# Patient Record
Sex: Female | Born: 1950
Health system: Southern US, Community
[De-identification: ages and names within clinical notes are randomized; demographics above are authoritative.]

## PROBLEM LIST (undated history)

## (undated) DIAGNOSIS — I10 Essential (primary) hypertension: Secondary | ICD-10-CM

## (undated) DIAGNOSIS — F32A Depression, unspecified: Secondary | ICD-10-CM

## (undated) DIAGNOSIS — Z8041 Family history of malignant neoplasm of ovary: Secondary | ICD-10-CM

## (undated) DIAGNOSIS — F329 Major depressive disorder, single episode, unspecified: Secondary | ICD-10-CM

## (undated) DIAGNOSIS — Z806 Family history of leukemia: Secondary | ICD-10-CM

## (undated) DIAGNOSIS — C801 Malignant (primary) neoplasm, unspecified: Secondary | ICD-10-CM

## (undated) DIAGNOSIS — Z803 Family history of malignant neoplasm of breast: Secondary | ICD-10-CM

## (undated) DIAGNOSIS — Z800 Family history of malignant neoplasm of digestive organs: Secondary | ICD-10-CM

## (undated) DIAGNOSIS — G709 Myoneural disorder, unspecified: Secondary | ICD-10-CM

## (undated) DIAGNOSIS — K2970 Gastritis, unspecified, without bleeding: Secondary | ICD-10-CM

## (undated) DIAGNOSIS — M5030 Other cervical disc degeneration, unspecified cervical region: Secondary | ICD-10-CM

## (undated) HISTORY — DX: Major depressive disorder, single episode, unspecified: F32.9

## (undated) HISTORY — DX: Family history of malignant neoplasm of breast: Z80.3

## (undated) HISTORY — DX: Myoneural disorder, unspecified: G70.9

## (undated) HISTORY — PX: TUBAL LIGATION: SHX77

## (undated) HISTORY — PX: BREAST SURGERY: SHX581

## (undated) HISTORY — DX: Family history of leukemia: Z80.6

## (undated) HISTORY — DX: Family history of malignant neoplasm of ovary: Z80.41

## (undated) HISTORY — DX: Depression, unspecified: F32.A

## (undated) HISTORY — DX: Family history of malignant neoplasm of digestive organs: Z80.0

## (undated) HISTORY — DX: Malignant (primary) neoplasm, unspecified: C80.1

## (undated) HISTORY — DX: Essential (primary) hypertension: I10

## (undated) HISTORY — DX: Other cervical disc degeneration, unspecified cervical region: M50.30

---

## 2011-01-22 ENCOUNTER — Emergency Department (HOSPITAL_BASED_OUTPATIENT_CLINIC_OR_DEPARTMENT_OTHER)
Admission: EM | Admit: 2011-01-22 | Discharge: 2011-01-22 | Disposition: A | Discharge status: Home / Self Care | Payer: Self-pay | Attending: Emergency Medicine | Admitting: Emergency Medicine

## 2011-01-22 DIAGNOSIS — K047 Periapical abscess without sinus: Secondary | ICD-10-CM | POA: Insufficient documentation

## 2015-10-03 DIAGNOSIS — K219 Gastro-esophageal reflux disease without esophagitis: Secondary | ICD-10-CM | POA: Insufficient documentation

## 2016-09-21 ENCOUNTER — Encounter: Payer: Self-pay | Admitting: Hematology & Oncology

## 2016-09-21 ENCOUNTER — Other Ambulatory Visit (HOSPITAL_BASED_OUTPATIENT_CLINIC_OR_DEPARTMENT_OTHER): Payer: Medicare HMO

## 2016-09-21 ENCOUNTER — Ambulatory Visit (HOSPITAL_BASED_OUTPATIENT_CLINIC_OR_DEPARTMENT_OTHER): Payer: Medicare HMO | Admitting: Hematology & Oncology

## 2016-09-21 ENCOUNTER — Ambulatory Visit: Payer: Medicare HMO

## 2016-09-21 VITALS — BP 173/78 | HR 76 | Temp 98.1°F | Resp 20 | Wt 125.0 lb

## 2016-09-21 DIAGNOSIS — Z800 Family history of malignant neoplasm of digestive organs: Secondary | ICD-10-CM

## 2016-09-21 DIAGNOSIS — N6459 Other signs and symptoms in breast: Secondary | ICD-10-CM

## 2016-09-21 DIAGNOSIS — D500 Iron deficiency anemia secondary to blood loss (chronic): Secondary | ICD-10-CM

## 2016-09-21 DIAGNOSIS — E559 Vitamin D deficiency, unspecified: Secondary | ICD-10-CM

## 2016-09-21 DIAGNOSIS — Z803 Family history of malignant neoplasm of breast: Secondary | ICD-10-CM

## 2016-09-21 DIAGNOSIS — Z8041 Family history of malignant neoplasm of ovary: Secondary | ICD-10-CM

## 2016-09-21 DIAGNOSIS — D510 Vitamin B12 deficiency anemia due to intrinsic factor deficiency: Secondary | ICD-10-CM

## 2016-09-21 DIAGNOSIS — M818 Other osteoporosis without current pathological fracture: Secondary | ICD-10-CM

## 2016-09-21 LAB — COMPREHENSIVE METABOLIC PANEL (CC13)
A/G RATIO: 1.2 (ref 1.2–2.2)
ALBUMIN: 4.2 g/dL (ref 3.6–4.8)
ALT: 12 IU/L (ref 0–32)
AST (SGOT): 16 IU/L (ref 0–40)
Alkaline Phosphatase, S: 63 IU/L (ref 39–117)
BUN / CREAT RATIO: 18 (ref 12–28)
BUN: 11 mg/dL (ref 8–27)
CHLORIDE: 103 mmol/L (ref 96–106)
Calcium, Ser: 9.6 mg/dL (ref 8.7–10.3)
Carbon Dioxide, Total: 28 mmol/L (ref 18–29)
Creatinine, Ser: 0.62 mg/dL (ref 0.57–1.00)
GFR calc non Af Amer: 95 mL/min/{1.73_m2} (ref >59)
GFR, EST AFRICAN AMERICAN: 109 mL/min/{1.73_m2} (ref >59)
GLUCOSE: 93 mg/dL (ref 65–99)
Globulin, Total: 3.4 g/dL (ref 1.5–4.5)
POTASSIUM: 3.6 mmol/L (ref 3.5–5.2)
SODIUM: 134 mmol/L (ref 134–144)
TOTAL PROTEIN: 7.6 g/dL (ref 6.0–8.5)

## 2016-09-21 LAB — CBC WITH DIFFERENTIAL (CANCER CENTER ONLY)
BASO#: 0 10*3/uL (ref 0.0–0.2)
BASO%: 0.3 % (ref 0.0–2.0)
EOS%: 0.9 % (ref 0.0–7.0)
Eosinophils Absolute: 0.1 10*3/uL (ref 0.0–0.5)
HEMATOCRIT: 35.9 % (ref 34.8–46.6)
HGB: 12 g/dL (ref 11.6–15.9)
LYMPH#: 2.3 10*3/uL (ref 0.9–3.3)
LYMPH%: 29.6 % (ref 14.0–48.0)
MCH: 30.1 pg (ref 26.0–34.0)
MCHC: 33.4 g/dL (ref 32.0–36.0)
MCV: 90 fL (ref 81–101)
MONO#: 0.4 10*3/uL (ref 0.1–0.9)
MONO%: 5.5 % (ref 0.0–13.0)
NEUT#: 5 10*3/uL (ref 1.5–6.5)
NEUT%: 63.7 % (ref 39.6–80.0)
Platelets: 242 10*3/uL (ref 145–400)
RBC: 3.99 10*6/uL (ref 3.70–5.32)
RDW: 12.3 % (ref 11.1–15.7)
WBC: 7.9 10*3/uL (ref 3.9–10.0)

## 2016-09-21 LAB — CHCC SATELLITE - SMEAR

## 2016-09-21 NOTE — Progress Notes (Signed)
09/21/2016 Pt states she is not taking the prescribed meds:  Gabapentin, Trazodone, Pantoprazole, Losartan, Valium.

## 2016-09-21 NOTE — Progress Notes (Signed)
Referral MD  Reason for Referral: Evaluate for malignancy   Chief Complaint  Patient presents with  . Initial Assessment    Lumps in breast, pain.  : I think I have cancer.  HPI: Deanna Reynolds is a very charming 66 year old African-American female. She is originally from [place]. [place]. She actually went back up to [place] for about 7 months to help take care of a family member. There is a very strong family history of malignancy. She says there's been audible family members with breast cancer, ovarian cancer, and pancreatic cancer.  Her last mammogram was done about 2 years ago. She's never had a colonoscopy.  She is complaining of some pain in the right breast. She has not noted any breast swelling. There's been no arm swelling.  She's had no problems with weight loss or weight gain. Her weight has gone down a little bit. She is not as hungry. She is not noting any change in bowels or bladder.  She has not had any rashes. There's been no bleeding. She did have endometriosis in the past.  She has had past surgery without any difficulties.  She has had no ROMs with her monthly cycles. She started when she was 66 years old. She had her menopause at about 66 years old.  She apparently is "financially challenged" right now. She says she is taking some of her medications because they are not helping. She is not having any test done because of lack of finances. I told her that we can always help with this.  She has not had any dysphagia or odynophagia.  She smoked for about 5 years. She's not smoked for several years.  She is a grandmother. Her immediate family are all doing well.  She has had no cough. She's had no nausea or vomiting.  Overall, her performance status is ECOG 0.   History reviewed. No pertinent past medical history.:  History reviewed. No pertinent surgical history.:   Current Outpatient Prescriptions:  .   DULoxetine (CYMBALTA) 30 MG capsule, 30 mg daily., Disp: , Rfl:  .  hydrochlorothiazide (HYDRODIURIL) 25 MG tablet, 25 mg., Disp: , Rfl:  .  HYDROcodone-acetaminophen (NORCO/VICODIN) 5-325 MG tablet, 5-325 tablets at bedtime as needed., Disp: , Rfl:  .  losartan (COZAAR) 100 MG tablet, , Disp: , Rfl: :  :  Allergies  Allergen Reactions  . Motrin [Ibuprofen] Nausea And Vomiting  :  History reviewed. No pertinent family history.:  Social History   Social History  . Marital status: Divorced    Spouse name: N/A  . Number of children: N/A  . Years of education: N/A   Occupational History  . Not on file.   Social History Main Topics  . Smoking status: Former Smoker    Packs/day: 0.25    Years: 5.00    Types: Cigarettes    Quit date: 08/21/2014  . Smokeless tobacco: Never Used  . Alcohol use No  . Drug use: No  . Sexual activity: Not Currently   Other Topics Concern  . Not on file   Social History Narrative  . No narrative on file  :  Pertinent items are noted in HPI.  Exam: @[facility] @ Well-developed and well-nourished African-American female in no obvious distress. Vital signs show temperature of 98.1. Pulse 76. Blood pressure 173/78. Weight is 125 pounds. Head head and neck exam shows no ocular or oral lesions. There are no palpable  cervical or supraclavicular lymph nodes. Lungs are clear bilaterally. She has no rales, wheezes or rhonchi. Cardiac exam regular rate and rhythm with no murmurs, rubs or bruits. Breast exam shows no breast masses bilaterally. There is no breast asymmetry. There is no nipple discharge bilaterally. She has no bilateral axillary adenopathy. Back exam shows no tenderness over the spine, ribs or hips. Abdominal exam shows a soft abdomen with good bowel sounds. There is no fluid wave. There is no palpable liver or spleen tip. Extremities shows no clubbing, cyanosis or edema. She has good range of motion of her joints. Back exam shows no kyphosis. Skin  exam shows no rashes, ecchymoses or petechia. Neurological exam shows no focal neurological deficits.    Recent Labs  09/21/16 1441  WBC 7.9  HGB 12.0  HCT 35.9  PLT 242   No results for input(s): NA, K, CL, CO2, GLUCOSE, BUN, CREATININE, CALCIUM in the last 72 hours.  Blood smear review:  None  Pathology: None     Assessment and Plan:  Deanna Reynolds is a very charming 66 year old African-American female. There is a strong family history of cancer. She, herself, does not have any obvious malignancy. However, she definitely needs to have annual studies done to prevent any malignancy from occurring.  The least we can do is get her set up with a mammogram.  I did gave her stool cards to take home. I told her how to do these. She will return these when she has completed the cards.  I don't see that we need any CAT scans or chest x-ray.  Of note, she has a very low vitamin D level that was run by her family doctor. I told her to take 2000 units of vitamin D daily.  I did give her a prayer blanket. She has a very strong faith. We had good fellowship. I spent about 50 minutes with her today.  She felt much better after talking with [place]. She will need to have a genetic test done given her strong family history. We can get this arranged we see her next.  I probably will get her back to be seen in another 6 weeks.

## 2016-09-22 LAB — IRON AND TIBC
%SAT: 33 % (ref 21–57)
IRON: 105 ug/dL (ref 41–142)
TIBC: 320 ug/dL (ref 236–444)
UIBC: 215 ug/dL (ref 120–384)

## 2016-09-22 LAB — RETICULOCYTES: RETICULOCYTE COUNT: 0.7 % (ref 0.6–2.6)

## 2016-09-22 LAB — VITAMIN B12: VITAMIN B 12: 344 pg/mL (ref 232–1245)

## 2016-09-22 LAB — FERRITIN: Ferritin: 21 ng/ml (ref 9–269)

## 2016-09-22 LAB — LACTATE DEHYDROGENASE: LDH: 172 U/L (ref 125–245)

## 2016-10-01 ENCOUNTER — Ambulatory Visit (HOSPITAL_BASED_OUTPATIENT_CLINIC_OR_DEPARTMENT_OTHER)
Admission: RE | Admit: 2016-10-01 | Discharge: 2016-10-01 | Disposition: A | Discharge status: Home / Self Care | Payer: Medicare HMO | Source: Ambulatory Visit | Attending: Hematology & Oncology | Admitting: Hematology & Oncology

## 2016-10-01 ENCOUNTER — Encounter (HOSPITAL_BASED_OUTPATIENT_CLINIC_OR_DEPARTMENT_OTHER): Payer: Self-pay | Admitting: Radiology

## 2016-10-01 ENCOUNTER — Other Ambulatory Visit: Payer: Self-pay | Admitting: Hematology & Oncology

## 2016-10-01 DIAGNOSIS — Z803 Family history of malignant neoplasm of breast: Secondary | ICD-10-CM

## 2016-10-01 DIAGNOSIS — Z1231 Encounter for screening mammogram for malignant neoplasm of breast: Secondary | ICD-10-CM | POA: Insufficient documentation

## 2016-10-07 ENCOUNTER — Other Ambulatory Visit: Payer: Self-pay | Admitting: Hematology & Oncology

## 2016-10-07 DIAGNOSIS — R928 Other abnormal and inconclusive findings on diagnostic imaging of breast: Secondary | ICD-10-CM

## 2016-10-12 ENCOUNTER — Ambulatory Visit
Admission: RE | Admit: 2016-10-12 | Discharge: 2016-10-12 | Disposition: A | Discharge status: Home / Self Care | Payer: Medicare HMO | Source: Ambulatory Visit | Attending: Hematology & Oncology | Admitting: Hematology & Oncology

## 2016-10-12 DIAGNOSIS — R928 Other abnormal and inconclusive findings on diagnostic imaging of breast: Secondary | ICD-10-CM

## 2016-11-01 ENCOUNTER — Other Ambulatory Visit: Payer: Self-pay | Admitting: *Deleted

## 2016-11-01 DIAGNOSIS — K219 Gastro-esophageal reflux disease without esophagitis: Secondary | ICD-10-CM

## 2016-11-02 ENCOUNTER — Other Ambulatory Visit (HOSPITAL_BASED_OUTPATIENT_CLINIC_OR_DEPARTMENT_OTHER): Payer: Medicare HMO

## 2016-11-02 ENCOUNTER — Ambulatory Visit (HOSPITAL_BASED_OUTPATIENT_CLINIC_OR_DEPARTMENT_OTHER): Payer: Medicare HMO | Admitting: Hematology & Oncology

## 2016-11-02 VITALS — BP 135/66 | HR 85 | Temp 98.2°F | Resp 16 | Wt 117.0 lb

## 2016-11-02 DIAGNOSIS — Z809 Family history of malignant neoplasm, unspecified: Secondary | ICD-10-CM

## 2016-11-02 DIAGNOSIS — N6310 Unspecified lump in the right breast, unspecified quadrant: Secondary | ICD-10-CM

## 2016-11-02 DIAGNOSIS — K219 Gastro-esophageal reflux disease without esophagitis: Secondary | ICD-10-CM

## 2016-11-02 DIAGNOSIS — M818 Other osteoporosis without current pathological fracture: Secondary | ICD-10-CM

## 2016-11-02 DIAGNOSIS — N63 Unspecified lump in unspecified breast: Secondary | ICD-10-CM

## 2016-11-02 LAB — CBC WITH DIFFERENTIAL (CANCER CENTER ONLY)
BASO#: 0 10*3/uL (ref 0.0–0.2)
BASO%: 0.3 % (ref 0.0–2.0)
EOS ABS: 0.1 10*3/uL (ref 0.0–0.5)
EOS%: 0.8 % (ref 0.0–7.0)
HEMATOCRIT: 37.2 % (ref 34.8–46.6)
HEMOGLOBIN: 12.4 g/dL (ref 11.6–15.9)
LYMPH#: 2.7 10*3/uL (ref 0.9–3.3)
LYMPH%: 35.3 % (ref 14.0–48.0)
MCH: 29.2 pg (ref 26.0–34.0)
MCHC: 33.3 g/dL (ref 32.0–36.0)
MCV: 88 fL (ref 81–101)
MONO#: 0.4 10*3/uL (ref 0.1–0.9)
MONO%: 4.7 % (ref 0.0–13.0)
NEUT%: 58.9 % (ref 39.6–80.0)
NEUTROS ABS: 4.5 10*3/uL (ref 1.5–6.5)
Platelets: 214 10*3/uL (ref 145–400)
RBC: 4.24 10*6/uL (ref 3.70–5.32)
RDW: 12.7 % (ref 11.1–15.7)
WBC: 7.7 10*3/uL (ref 3.9–10.0)

## 2016-11-02 LAB — COMPREHENSIVE METABOLIC PANEL (CC13)
ALBUMIN: 4.2 g/dL (ref 3.6–4.8)
ALK PHOS: 57 IU/L (ref 39–117)
ALT: 10 IU/L (ref 0–32)
AST: 14 IU/L (ref 0–40)
Albumin/Globulin Ratio: 1.3 (ref 1.2–2.2)
BILIRUBIN TOTAL: 0.3 mg/dL (ref 0.0–1.2)
BUN / CREAT RATIO: 17 (ref 12–28)
BUN: 11 mg/dL (ref 8–27)
CHLORIDE: 101 mmol/L (ref 96–106)
CREATININE: 0.63 mg/dL (ref 0.57–1.00)
Calcium, Ser: 9.4 mg/dL (ref 8.7–10.3)
Carbon Dioxide, Total: 28 mmol/L (ref 18–29)
GFR calc Af Amer: 108 mL/min/{1.73_m2} (ref >59)
GFR calc non Af Amer: 94 mL/min/{1.73_m2} (ref >59)
GLOBULIN, TOTAL: 3.2 g/dL (ref 1.5–4.5)
GLUCOSE: 137 mg/dL — AB (ref 65–99)
Potassium, Ser: 3.6 mmol/L (ref 3.5–5.2)
SODIUM: 137 mmol/L (ref 134–144)
TOTAL PROTEIN: 7.4 g/dL (ref 6.0–8.5)

## 2016-11-02 NOTE — Progress Notes (Signed)
Hematology and Oncology Follow Up Visit  Deanna Reynolds [id number] Nov 11, 1950 66 y.o. 11/02/2016   Principle Diagnosis:   Strong family history of cancer  Current Therapy:    Observation     Interim History:  Deanna Reynolds is back for a second office visit. We saw her back in February. At that time, she came in with a strong family history of malignancy. Several family members have had different kind of malignancies.  We did get a mammogram on her. She had a mammogram done. This was done in February. This showed a lesion at the 10:00 position in the right breast. An ultrasound was then done. This showed a 4 x 3 x 6 mm hypo-echoic mass. This was felt to be fibrocystic changes.  Deanna Reynolds was not pleased with her visit to the mammography center. She is very concerned about this lesion. I think the only way that we can probably help her is to get a MRI of the breast. She is agreeable to this.  We saw her, we gave her stool cards. She is not yet done this. I told her to get this done and please bring them to our office.  I will get her set up with a genetic counselor so that she can go over Deanna Reynolds's family history to see what type of genetic testing he is to be done.  We last saw her, her labs all looked okay. She was not iron deficient.  She has had no fever. She has had no rashes. She has had no obvious changes with her breast.  Overall, her performance status is ECOG 1.  Medications:  Current Outpatient Prescriptions:  .  DULoxetine (CYMBALTA) 30 MG capsule, 30 mg daily., Disp: , Rfl:  .  hydrochlorothiazide (HYDRODIURIL) 25 MG tablet, 25 mg., Disp: , Rfl:  .  HYDROcodone-acetaminophen (NORCO/VICODIN) 5-325 MG tablet, 5-325 tablets at bedtime as needed., Disp: , Rfl:  .  losartan (COZAAR) 100 MG tablet, , Disp: , Rfl:   Allergies:  Allergies  Allergen Reactions  . Motrin [Ibuprofen] Nausea And Vomiting    Past Medical History, Surgical history, Social history, and Family  History were reviewed and updated.  Review of Systems: As above  Physical Exam:  weight is 117 lb (53.1 kg). Her oral temperature is 98.2 F (36.8 C). Her blood pressure is 135/66 and her pulse is 85. Her respiration is 16 and oxygen saturation is 99%.   Wt Readings from Last 3 Encounters:  11/02/16 117 lb (53.1 kg)  09/21/16 125 lb (56.7 kg)     Well-developed and well-nourished African-American female. Her head and neck exam shows no ocular or oral lesions. There are no palpable cervical or supraclavicular lymph nodes. Lungs are clear bilaterally. She has no rales, wheezes or rhonchi. Cardiac exam regular rate and rhythm with no murmurs, rubs or bruits. Breast exam shows no breast masses bilaterally. There is no breast asymmetry. There is no nipple discharge bilaterally. She has no bilateral axillary adenopathy. Back exam shows no tenderness over the spine, ribs or hips. Abdominal exam shows a soft abdomen with good bowel sounds. There is no fluid wave. There is no palpable liver or spleen tip. Extremities shows no clubbing, cyanosis or edema. She has good range of motion of her joints. Back exam shows no kyphosis. Skin exam shows no rashes, ecchymoses or petechia. Neurological exam shows no focal neurological deficits.   Lab Results  Component Value Date   WBC 7.7 11/02/2016   HGB 12.4 11/02/2016  HCT 37.2 11/02/2016   MCV 88 11/02/2016   PLT 214 11/02/2016     Chemistry      Component Value Date/Time   NA 134 09/21/2016 1441   K 3.6 09/21/2016 1441   CL 103 09/21/2016 1441   CO2 28 09/21/2016 1441   BUN 11 09/21/2016 1441   CREATININE 0.62 09/21/2016 1441      Component Value Date/Time   CALCIUM 9.6 09/21/2016 1441   ALKPHOS 63 09/21/2016 1441   AST 16 09/21/2016 1441   ALT 12 09/21/2016 1441   BILITOT <0.2 09/21/2016 1441         Impression and Plan: Deanna Reynolds is a 66 year old African-American female. From my perspective, she does not have any obvious malignancy.  However, she is still very worried about this.  I told her that she needs to have the stool cards done. If there is blood, then she will clearly need to have a colonoscopy and possibly upper endoscopy.  We will see about getting an MRI done. This will make her feel a lot better.  We spent about 35 minutes with her.  I will plan to see her back in about 6 weeks or so. Hopefully, she will have seen the genetic counselor. Hopefully she wouldn't have brought in the stool cards.     Volanda Napoleon, MD 3/28/20185:20 PM

## 2016-11-03 LAB — VITAMIN D 25 HYDROXY (VIT D DEFICIENCY, FRACTURES): Vitamin D, 25-Hydroxy: 28.5 ng/mL — ABNORMAL LOW (ref 30.0–100.0)

## 2016-11-04 ENCOUNTER — Telehealth: Payer: Self-pay | Admitting: *Deleted

## 2016-11-04 NOTE — Telephone Encounter (Addendum)
Patient is aware of results. She is currently taking 1000 units. Will increase to 2000 units  ----- Message from Volanda Napoleon, MD sent at 11/03/2016  6:45 AM EDT ----- Call - Vit D is low!!  You must be taking 2000units a day!!!! pete

## 2016-11-13 ENCOUNTER — Other Ambulatory Visit: Payer: Medicare HMO

## 2016-11-29 ENCOUNTER — Other Ambulatory Visit: Payer: Self-pay | Admitting: Lab

## 2016-11-29 ENCOUNTER — Other Ambulatory Visit: Payer: Self-pay | Admitting: *Deleted

## 2016-11-29 ENCOUNTER — Telehealth: Payer: Self-pay | Admitting: *Deleted

## 2016-11-29 DIAGNOSIS — Z803 Family history of malignant neoplasm of breast: Secondary | ICD-10-CM | POA: Diagnosis not present

## 2016-11-29 DIAGNOSIS — K219 Gastro-esophageal reflux disease without esophagitis: Secondary | ICD-10-CM

## 2016-11-29 DIAGNOSIS — Z800 Family history of malignant neoplasm of digestive organs: Secondary | ICD-10-CM | POA: Diagnosis not present

## 2016-11-29 DIAGNOSIS — Z8041 Family history of malignant neoplasm of ovary: Secondary | ICD-10-CM

## 2016-11-29 DIAGNOSIS — N6310 Unspecified lump in the right breast, unspecified quadrant: Secondary | ICD-10-CM | POA: Diagnosis not present

## 2016-11-29 LAB — FECAL OCCULT BLOOD, GUIAC - CHCC SATELLITE: Occult Blood: NEGATIVE

## 2016-11-29 NOTE — Telephone Encounter (Addendum)
Patient aware of results  ----- Message from Volanda Napoleon, MD sent at 11/29/2016 10:14 AM EDT ----- Call - the stool cards are all NEGATIVE for blood!!!  pete

## 2016-12-15 ENCOUNTER — Other Ambulatory Visit: Payer: Medicare HMO

## 2016-12-15 ENCOUNTER — Ambulatory Visit: Payer: Medicare HMO | Admitting: Hematology & Oncology

## 2016-12-28 ENCOUNTER — Encounter: Payer: Self-pay | Admitting: Genetics

## 2017-01-11 ENCOUNTER — Ambulatory Visit
Admission: RE | Admit: 2017-01-11 | Discharge: 2017-01-11 | Disposition: A | Discharge status: Home / Self Care | Payer: Medicare HMO | Source: Ambulatory Visit | Attending: Hematology & Oncology | Admitting: Hematology & Oncology

## 2017-01-11 DIAGNOSIS — N63 Unspecified lump in unspecified breast: Secondary | ICD-10-CM

## 2017-01-11 DIAGNOSIS — Z809 Family history of malignant neoplasm, unspecified: Secondary | ICD-10-CM

## 2017-01-11 MED ORDER — GADOBENATE DIMEGLUMINE 529 MG/ML IV SOLN
10.0000 mL | Freq: Once | INTRAVENOUS | Status: AC | PRN
  Administered 2017-01-11: 10 mL via INTRAVENOUS

## 2017-01-16 ENCOUNTER — Other Ambulatory Visit: Payer: Self-pay | Admitting: Hematology & Oncology

## 2017-01-16 DIAGNOSIS — N6310 Unspecified lump in the right breast, unspecified quadrant: Secondary | ICD-10-CM

## 2017-01-17 ENCOUNTER — Other Ambulatory Visit: Payer: Self-pay | Admitting: Family

## 2017-01-17 DIAGNOSIS — N63 Unspecified lump in unspecified breast: Secondary | ICD-10-CM

## 2017-01-18 ENCOUNTER — Other Ambulatory Visit: Payer: Medicare HMO

## 2017-01-18 ENCOUNTER — Ambulatory Visit: Payer: Medicare HMO | Admitting: Hematology & Oncology

## 2017-01-26 ENCOUNTER — Other Ambulatory Visit: Payer: Medicare HMO

## 2017-01-26 ENCOUNTER — Encounter: Payer: Medicare HMO | Admitting: Genetics

## 2017-01-26 ENCOUNTER — Ambulatory Visit
Admission: RE | Admit: 2017-01-26 | Discharge: 2017-01-26 | Disposition: A | Discharge status: Home / Self Care | Payer: Medicare HMO | Source: Ambulatory Visit | Attending: Family | Admitting: Family

## 2017-01-26 DIAGNOSIS — N63 Unspecified lump in unspecified breast: Secondary | ICD-10-CM

## 2017-01-26 MED ORDER — GADOBENATE DIMEGLUMINE 529 MG/ML IV SOLN
10.0000 mL | Freq: Once | INTRAVENOUS | Status: AC | PRN
  Administered 2017-01-26: 10 mL via INTRAVENOUS

## 2017-02-13 ENCOUNTER — Other Ambulatory Visit: Payer: Self-pay | Admitting: Surgery

## 2017-02-13 DIAGNOSIS — R1011 Right upper quadrant pain: Secondary | ICD-10-CM

## 2017-02-14 ENCOUNTER — Telehealth: Payer: Self-pay | Admitting: *Deleted

## 2017-02-14 ENCOUNTER — Encounter: Payer: Self-pay | Admitting: Physician Assistant

## 2017-02-14 NOTE — Telephone Encounter (Signed)
Patient is calling to cancel tomorrows appointment. She wants to take care of several health items before returning to this office for further care. She is being worked up for possible gallbladder issues and needs to have a colonoscopy. She states she has appointments for both and once everything is taken care of she will reschedule here.  Reviewed with Dr Marin Olp. Appointment tomorrow cancelled.

## 2017-02-15 ENCOUNTER — Other Ambulatory Visit: Payer: Medicare HMO

## 2017-02-15 ENCOUNTER — Ambulatory Visit: Payer: Medicare HMO | Admitting: Hematology & Oncology

## 2017-02-22 ENCOUNTER — Other Ambulatory Visit: Payer: Medicare HMO

## 2017-02-22 ENCOUNTER — Other Ambulatory Visit: Payer: Self-pay | Admitting: Surgery

## 2017-02-22 ENCOUNTER — Ambulatory Visit
Admission: RE | Admit: 2017-02-22 | Discharge: 2017-02-22 | Disposition: A | Discharge status: Home / Self Care | Payer: Medicare HMO | Source: Ambulatory Visit | Attending: Surgery | Admitting: Surgery

## 2017-02-22 ENCOUNTER — Encounter: Payer: Self-pay | Admitting: Physician Assistant

## 2017-02-22 ENCOUNTER — Ambulatory Visit (INDEPENDENT_AMBULATORY_CARE_PROVIDER_SITE_OTHER): Payer: Medicare HMO | Admitting: Physician Assistant

## 2017-02-22 VITALS — BP 110/60 | HR 68 | Ht 63.0 in | Wt 111.0 lb

## 2017-02-22 DIAGNOSIS — R52 Pain, unspecified: Secondary | ICD-10-CM

## 2017-02-22 DIAGNOSIS — R194 Change in bowel habit: Secondary | ICD-10-CM | POA: Diagnosis not present

## 2017-02-22 DIAGNOSIS — R634 Abnormal weight loss: Secondary | ICD-10-CM | POA: Diagnosis not present

## 2017-02-22 DIAGNOSIS — R112 Nausea with vomiting, unspecified: Secondary | ICD-10-CM

## 2017-02-22 DIAGNOSIS — R1084 Generalized abdominal pain: Secondary | ICD-10-CM | POA: Diagnosis not present

## 2017-02-22 MED ORDER — IOPAMIDOL (ISOVUE-300) INJECTION 61%
100.0000 mL | Freq: Once | INTRAVENOUS | Status: AC | PRN
  Administered 2017-02-22: 100 mL via INTRAVENOUS

## 2017-02-22 MED ORDER — NA SULFATE-K SULFATE-MG SULF 17.5-3.13-1.6 GM/177ML PO SOLN: ORAL | 0 refills | Status: DC

## 2017-02-22 NOTE — Patient Instructions (Signed)
You have been scheduled for a colonoscopy and endoscopy. Please follow written instructions given to you at your visit today.  Please pick up your prep supplies at the pharmacy within the next 1-3 days.  If you use inhalers (even only as needed), please bring them with you on the day of your procedure. Your physician has requested that you go to www.[email or web address].com and enter the access code given to you at your visit today. This web site gives a general overview about your procedure. However, you should still follow specific instructions given to you by our office regarding your preparation for the procedure.  We have put you on a wait list for a sooner procedure appointment.

## 2017-02-22 NOTE — Progress Notes (Signed)
Chief Complaint: Weight loss, abdominal pain, nausea and vomiting, change in bowel habits  HPI:  Deanna Reynolds is a 66 year old African-American female with a past medical history as below, who was referred to me by Donnie Mesa, MD for a complaint of change in bowel habits, weight loss, nausea and vomiting and abdominal pain .     Today, the patient presents to clinic and tells me that for the past year she has had episodic nausea and vomiting symptoms which occur 1- 2 times per week. She tells me that her previous primary care provider was just "treating me with pills", and never "would find out what was going on". Patient stopped the Zofran which she says was somewhat helpful because she wanted to figure out what was going on. She tells me that this is "unexpected". It happens out of the blue and is unrelated to eating.     Also describes an epigastric/right upper quadrant abdominal pain which has been occurring over that period of time as well. She describes this as an 8/10 pain which is sharp in the last 20 minutes when it occurs. This seems somewhat worse when she eats things such as "pasta with white rice". She also tells me that when she exercises she feels as though something "moves out of place", and she has to "put it back" right under the right side of her rib cage. This has been getting worse recently as well. Patient is scheduled for a CT later today.   Patient also tells me today that she has had a change from regular stools to a mix between diarrhea and normal over the past year. She never knows what she is going to get. Associated symptoms included a weight loss of around 30 pounds in 5 months without trying. Patient also describes occasional subjective fever and chills.   Patient's family history is positive for cancer of the breast and ovaries, patient tells me that every member of her family died from cancer and "never of natural causes".   Patient denies seeing bright red blood or  black tarry sticky stools, change in diet or change in medications, anorexia, heartburn, reflux or symptoms that awaken her at night.  Past Medical History:  Diagnosis Date  . DDD (degenerative disc disease), cervical   . Depression   . Hypertension     Past Surgical History:  Procedure Laterality Date  . TUBAL LIGATION      Current Outpatient Prescriptions  Medication Sig Dispense Refill  . Ascorbic Acid (VITAMIN C) 1000 MG tablet Take 1,000 mg by mouth daily.    . Cholecalciferol (VITAMIN D-3) 5000 units TABS Take 1 tablet by mouth daily.    . Cyanocobalamin (VITAMIN B-12 PO) Take 1 tablet by mouth daily.    . Flaxseed, Linseed, (FLAX SEED OIL PO) Take by mouth. One teaspoon daily    . hydrochlorothiazide (HYDRODIURIL) 25 MG tablet 25 mg.    . HYDROcodone-acetaminophen (NORCO/VICODIN) 5-325 MG tablet 5-325 tablets at bedtime as needed.    Marland Kitchen losartan (COZAAR) 100 MG tablet     . MAGNESIUM PO Take 1 tablet by mouth daily.    Marland Kitchen VITAMIN E PO Take 1 tablet by mouth daily.    . Na Sulfate-K Sulfate-Mg Sulf 17.5-3.13-1.6 GM/180ML SOLN Take as directed for colonoscopy 354 mL 0   No current facility-administered medications for this visit.     Allergies as of 02/22/2017 - Review Complete 02/22/2017  Allergen Reaction Noted  . Motrin [ibuprofen] Nausea And Vomiting  09/21/2016    Family History  Problem Relation Age of Onset  . Breast cancer Mother   . Breast cancer Sister   . Breast cancer Maternal Aunt   . Breast cancer Maternal Grandmother   . Breast cancer Cousin   . Leukemia Father   . Colon cancer Neg Hx     Social History   Social History  . Marital status: Divorced    Spouse name: N/A  . Number of children: 1  . Years of education: N/A   Occupational History  . retired Biomedical scientist    Social History Main Topics  . Smoking status: Former Smoker    Packs/day: 0.25    Years: 5.00    Types: Cigarettes    Quit date: 08/21/2014  . Smokeless tobacco:  Never Used  . Alcohol use No  . Drug use: No  . Sexual activity: Not Currently   Other Topics Concern  . Not on file   Social History Narrative  . No narrative on file    Review of Systems:    Constitutional: No weight loss, fever or chills Skin: No rash Cardiovascular: No chest pain Respiratory: No SOB  Gastrointestinal: See HPI and otherwise negative Genitourinary: No dysuria  Neurological: No headache Musculoskeletal: No new muscle or joint pain Hematologic: No bleeding  Psychiatric: No history of depression or anxiety    Physical Exam:  Vital signs: BP 110/60   Pulse 68   Ht 5\' 3"  (1.6 m)   Wt 111 lb (50.3 kg)   BMI 19.66 kg/m   Constitutional:   Pleasant thin appearing African American female appears to be in NAD, Well developed, Well nourished, alert and cooperative Head:  Normocephalic and atraumatic. Eyes:   PEERL, EOMI. No icterus. Conjunctiva pink. Ears:  Normal auditory acuity. Neck:  Supple Throat: Oral cavity and pharynx without inflammation, swelling or lesion.  Respiratory: Respirations even and unlabored. Lungs clear to auscultation bilaterally.   No wheezes, crackles, or rhonchi.  Cardiovascular: Normal S1, S2. No MRG. Regular rate and rhythm. No peripheral edema, cyanosis or pallor.  Gastrointestinal:  Soft, nondistended,Mild epigastric and RUQ ttp No rebound or guarding. Normal bowel sounds. No appreciable masses or hepatomegaly. Rectal:  Not performed.  Msk:  Symmetrical without gross deformities. Without edema, no deformity or joint abnormality.  Neurologic:  Alert and  oriented x4;  grossly normal neurologically.  Skin:   Dry and intact without significant lesions or rashes. Psychiatric: Demonstrates good judgement and reason without abnormal affect or behaviors.  MOST RECENT LABS AND IMAGING: CBC    Component Value Date/Time   WBC 7.7 11/02/2016 1421   RBC 4.24 11/02/2016 1421   HGB 12.4 11/02/2016 1421   HCT 37.2 11/02/2016 1421   PLT  214 11/02/2016 1421   MCV 88 11/02/2016 1421   MCH 29.2 11/02/2016 1421   MCHC 33.3 11/02/2016 1421   RDW 12.7 11/02/2016 1421   LYMPHSABS 2.7 11/02/2016 1421   EOSABS 0.1 11/02/2016 1421   BASOSABS 0.0 11/02/2016 1421    CMP     Component Value Date/Time   NA 137 11/02/2016 1421   K 3.6 11/02/2016 1421   CL 101 11/02/2016 1421   CO2 28 11/02/2016 1421   GLUCOSE 137 (H) 11/02/2016 1421   BUN 11 11/02/2016 1421   CREATININE 0.63 11/02/2016 1421   CALCIUM 9.4 11/02/2016 1421   PROT 7.4 11/02/2016 1421   ALBUMIN 4.2 11/02/2016 1421   AST 14 11/02/2016 1421   ALT 10 11/02/2016 1421  ALKPHOS 57 11/02/2016 1421   BILITOT 0.3 11/02/2016 1421   GFRNONAA 94 11/02/2016 1421   GFRAA 108 11/02/2016 1421    Assessment: 1. Weight loss: Around 35 pounds in the past 5 months without trying, significant family history of cancer; concern for cancer vs other 2. Abdominal pain: Epigastric/right upper quadrant, seems to be related to eating, question gallbladder versus gastric etiology 3. Change in bowel habits: Patient describes varying between normal and loose stools over the past year; question relation IBS versus other 4. Nausea and vomiting: Intermittent episodes 1-2 times per week unrelated to eating per the patient, some help from Zofran in the past which patient has since discontinued because "I don't want to be on medicine if I don't know what I'm treating"; Consider gastritis versus functional dyspepsia versus other  Plan: 1. Await results of CT abdomen and pelvis scheduled today, previously ordered by oncologist 2. Recommend a colonoscopy and endoscopy for further evaluation of significant weight loss. This is scheduled with Dr. Ardis Hughs in [facility]. Discussed risks, benefits, limitations and alternatives and the patient agrees to proceed. 3. Patient to follow in clinic per recommendations from Dr. Ardis Hughs after time of procedure.  Ellouise Newer, PA-C [facility] 02/22/2017,  1:48 PM  Cc: Donnie Mesa, MD

## 2017-02-22 NOTE — Progress Notes (Signed)
I agree with the above note, plan 

## 2017-02-24 ENCOUNTER — Other Ambulatory Visit: Payer: Medicare HMO

## 2017-02-27 NOTE — Progress Notes (Signed)
Please call the patient and let them know that their CT scan did not show any sign of suspicious masses.  Her stomach looked thickened, which may represent gastritis.  She needs a referral to GI for possible endoscopy.  I need to see her after that.  Thanks

## 2017-03-01 ENCOUNTER — Other Ambulatory Visit: Payer: Medicare HMO

## 2017-04-05 ENCOUNTER — Encounter: Payer: Self-pay | Admitting: Gastroenterology

## 2017-04-05 ENCOUNTER — Ambulatory Visit (AMBULATORY_SURGERY_CENTER): Payer: Medicare HMO | Admitting: Gastroenterology

## 2017-04-05 VITALS — BP 124/72 | HR 68 | Temp 98.7°F | Resp 12 | Ht 63.0 in | Wt 111.0 lb

## 2017-04-05 DIAGNOSIS — R634 Abnormal weight loss: Secondary | ICD-10-CM

## 2017-04-05 DIAGNOSIS — K2990 Gastroduodenitis, unspecified, without bleeding: Secondary | ICD-10-CM | POA: Diagnosis not present

## 2017-04-05 DIAGNOSIS — K2950 Unspecified chronic gastritis without bleeding: Secondary | ICD-10-CM | POA: Diagnosis not present

## 2017-04-05 DIAGNOSIS — K6389 Other specified diseases of intestine: Secondary | ICD-10-CM

## 2017-04-05 DIAGNOSIS — R197 Diarrhea, unspecified: Secondary | ICD-10-CM

## 2017-04-05 DIAGNOSIS — K2980 Duodenitis without bleeding: Secondary | ICD-10-CM | POA: Diagnosis not present

## 2017-04-05 MED ORDER — SODIUM CHLORIDE 0.9 % IV SOLN: 500.0000 mL | INTRAVENOUS | Status: DC

## 2017-04-05 NOTE — Progress Notes (Signed)
Called to room to assist during endoscopic procedure.  Patient ID and intended procedure confirmed with present staff. Received instructions for my participation in the procedure from the performing physician.  

## 2017-04-05 NOTE — Op Note (Signed)
[facility] Patient Name: Deanna Reynolds Procedure Date: 04/05/2017 1:29 PM MRN: [id number] Endoscopist: Milus Banister , MD Age: 66 Referring MD:  Date of Birth: 05-22-1951 Gender: Female Account #: [id number] Procedure:                Upper GI endoscopy Indications:              Generalized abdominal pain, Abnormal CT of the GI                            tract, Nausea with vomiting, Weight loss Medicines:                Monitored Anesthesia Care Procedure:                Pre-Anesthesia Assessment:                           - Prior to the procedure, a History and Physical                            was performed, and patient medications and                            allergies were reviewed. The patient's tolerance of                            previous anesthesia was also reviewed. The risks                            and benefits of the procedure and the sedation                            options and risks were discussed with the patient.                            All questions were answered, and informed consent                            was obtained. Prior Anticoagulants: The patient has                            taken no previous anticoagulant or antiplatelet                            agents. ASA Grade Assessment: II - A patient with                            mild systemic disease. After reviewing the risks                            and benefits, the patient was deemed in                            satisfactory condition to undergo the procedure.  After obtaining informed consent, the endoscope was                            passed under direct vision. Throughout the                            procedure, the patient's blood pressure, pulse, and                            oxygen saturations were monitored continuously. The                            Model GIF-[id number]) scope was introduced                            through the  mouth, and advanced to the second part                            of duodenum. The upper GI endoscopy was                            accomplished without difficulty. The patient                            tolerated the procedure well. Scope In: Scope Out: Findings:                 The esophagus was normal.                           There was mild to moderated duodenitis in proximal                            duodenal bulb. This was biopsied.                           Mild inflammation characterized by erythema was                            found in the gastric antrum. Biopsies were taken                            with a cold forceps for histology.                           The proximal stomach was abnormal, edematous and                            somewhat snake-skin in appearance (not overtly                            neoplastic). Biopsies were taken.                           There were no gastric or esophageal varices.  The exam was otherwise without abnormality. Complications:            No immediate complications. Estimated blood loss:                            None. Estimated Blood Loss:     Estimated blood loss: none. Impression:               - Normal esophagus.                           - Gastritis and duodenitis, biopsied.                           - The examination was otherwise normal. Recommendation:           - Patient has a contact number available for                            emergencies. The signs and symptoms of potential                            delayed complications were discussed with the                            patient. Return to normal activities tomorrow.                            Written discharge instructions were provided to the                            patient.                           - Resume previous diet.                           - Continue present medications.                           - Await pathology  results.                           - Strict avoidance of NSAIDs. Milus Banister, MD 04/05/2017 2:04:52 PM This report has been signed electronically.

## 2017-04-05 NOTE — Op Note (Signed)
[name] Procedure Date: 04/05/2017 1:29 PM MRN: [id number] Endoscopist: Milus Banister , MD Age: 66 Referring MD:  Date of Birth: 1950-12-14 Gender: Female Account #: [id number] Procedure:                Colonoscopy Indications:              Generalized abdominal pain, Diarrhea, Weight loss Medicines:                Monitored Anesthesia Care Procedure:                Pre-Anesthesia Assessment:                           - Prior to the procedure, a History and Physical                            was performed, and patient medications and                            allergies were reviewed. The patient's tolerance of                            previous anesthesia was also reviewed. The risks                            and benefits of the procedure and the sedation                            options and risks were discussed with the patient.                            All questions were answered, and informed consent                            was obtained. Prior Anticoagulants: The patient has                            taken no previous anticoagulant or antiplatelet                            agents. ASA Grade Assessment: II - A patient with                            mild systemic disease. After reviewing the risks                            and benefits, the patient was deemed in                            satisfactory condition to undergo the procedure.                           After obtaining informed consent, the colonoscope  was passed under direct vision. Throughout the                            procedure, the patient's blood pressure, pulse, and                            oxygen saturations were monitored continuously. The                            Colonoscope was introduced through the anus and                            advanced to the the terminal ileum. The colonoscopy                            was  performed without difficulty. The patient                            tolerated the procedure well. The quality of the                            bowel preparation was good. The terminal ileum,                            ileocecal valve, appendiceal orifice, and rectum                            were photographed. Scope In: 1:34:10 PM Scope Out: 1:46:21 PM Scope Withdrawal Time: 0 hours 8 minutes 18 seconds  Total Procedure Duration: 0 hours 12 minutes 11 seconds  Findings:                 The terminal ileum appeared normal.                           There was a clean based, shallow, somewhat linear                            erosion on the distal component of the IC valve                            (NSAID effect?) Biopsies were taken with a cold                            forceps for histology.                           The exam was otherwise without abnormality on                            direct and retroflexion views.                           Biopsies for histology were taken with a cold  forceps from the entire colon for evaluation of                            microscopic colitis. Complications:            No immediate complications. Estimated blood loss:                            None. Estimated Blood Loss:     Estimated blood loss: none. Impression:               - The examined portion of the ileum was normal.                           - IC valve erosion (NSAID effect?), biopsied.                           - The examination was otherwise normal on direct                            and retroflexion views.                           - Biopsies were taken with a cold forceps from the                            entire colon for evaluation of microscopic colitis. Recommendation:           - Patient has a contact number available for                            emergencies. The signs and symptoms of potential                            delayed complications  were discussed with the                            patient. Return to normal activities tomorrow.                            Written discharge instructions were provided to the                            patient.                           - Resume previous diet.                           - Continue present medications.                           - Await pathology results.                           - Repeat colonoscopy in 10 years for screening  purposes. Milus Banister, MD 04/05/2017 2:00:09 PM This report has been signed electronically.

## 2017-04-05 NOTE — Progress Notes (Signed)
Report to PACU, RN, vss, BBS= Clear.  

## 2017-04-05 NOTE — Patient Instructions (Signed)
Discharge instructions given. Biopsies taken. Resume previous medications. Avoid NSAIDS. YOU HAD AN ENDOSCOPIC PROCEDURE TODAY AT [facility]:   Refer to the procedure report that was given to you for any specific questions about what was found during the examination.  If the procedure report does not answer your questions, please call your gastroenterologist to clarify.  If you requested that your care partner not be given the details of your procedure findings, then the procedure report has been included in a sealed envelope for you to review at your convenience later.  YOU SHOULD EXPECT: Some feelings of bloating in the abdomen. Passage of more gas than usual.  Walking can help get rid of the air that was put into your GI tract during the procedure and reduce the bloating. If you had a lower endoscopy (such as a colonoscopy or flexible sigmoidoscopy) you may notice spotting of blood in your stool or on the toilet paper. If you underwent a bowel prep for your procedure, you may not have a normal bowel movement for a few days.  Please Note:  You might notice some irritation and congestion in your nose or some drainage.  This is from the oxygen used during your procedure.  There is no need for concern and it should clear up in a day or so.  SYMPTOMS TO REPORT IMMEDIATELY:   Following lower endoscopy (colonoscopy or flexible sigmoidoscopy):  Excessive amounts of blood in the stool  Significant tenderness or worsening of abdominal pains  Swelling of the abdomen that is new, acute  Fever of 100F or higher   Following upper endoscopy (EGD)  Vomiting of blood or coffee ground material  New chest pain or pain under the shoulder blades  Painful or persistently difficult swallowing  New shortness of breath  Fever of 100F or higher  Black, tarry-looking stools  For urgent or emergent issues, a gastroenterologist can be reached at any hour by calling [phone].   DIET:   We do recommend a small meal at first, but then you may proceed to your regular diet.  Drink plenty of fluids but you should avoid alcoholic beverages for 24 hours.  ACTIVITY:  You should plan to take it easy for the rest of today and you should NOT DRIVE or use heavy machinery until tomorrow (because of the sedation medicines used during the test).    FOLLOW UP: Our staff will call the number listed on your records the next business day following your procedure to check on you and address any questions or concerns that you may have regarding the information given to you following your procedure. If we do not reach you, we will leave a message.  However, if you are feeling well and you are not experiencing any problems, there is no need to return our call.  We will assume that you have returned to your regular daily activities without incident.  If any biopsies were taken you will be contacted by phone or by letter within the next 1-3 weeks.  Please call us at [phone] if you have not heard about the biopsies in 3 weeks.    SIGNATURES/CONFIDENTIALITY: You and/or your care partner have signed paperwork which will be entered into your electronic medical record.  These signatures attest to the fact that that the information above on your After Visit Summary has been reviewed and is understood.  Full responsibility of the confidentiality of this discharge information lies with you and/or your care-partner.

## 2017-04-06 ENCOUNTER — Telehealth: Payer: Self-pay | Admitting: *Deleted

## 2017-04-06 NOTE — Telephone Encounter (Signed)
  Follow up Call-  Call back number 04/05/2017  Post procedure Call Back phone  # [phone]  Permission to leave phone message Yes  Some recent data might be hidden    Called [phone] Patient questions:  Do you have a fever, pain , or abdominal swelling? No. Pain Score  0 *  Have you tolerated food without any problems? Yes.    Have you been able to return to your normal activities? Yes.    Do you have any questions about your discharge instructions: Diet   No. Medications  No. Follow up visit  No.  Do you have questions or concerns about your Care? No.  Actions: * If pain score is 4 or above: No action needed, pain <4.

## 2017-04-12 ENCOUNTER — Other Ambulatory Visit: Payer: Self-pay

## 2017-04-12 ENCOUNTER — Encounter: Payer: Self-pay | Admitting: Gastroenterology

## 2017-04-12 MED ORDER — BIS SUBCIT-METRONID-TETRACYC 140-125-125 MG PO CAPS: 3.0000 | ORAL_CAPSULE | Freq: Three times a day (TID) | ORAL | 0 refills | Status: DC

## 2017-04-13 ENCOUNTER — Telehealth: Payer: Self-pay | Admitting: Gastroenterology

## 2017-04-13 NOTE — Telephone Encounter (Signed)
Pharmacy notified that prior Josem Kaufmann has been completed, waiting for response.

## 2017-04-17 ENCOUNTER — Ambulatory Visit: Payer: Self-pay | Admitting: Surgery

## 2017-04-17 DIAGNOSIS — N6091 Unspecified benign mammary dysplasia of right breast: Secondary | ICD-10-CM

## 2017-04-17 NOTE — H&P (Signed)
History of Present Illness Deanna Reynolds. Jams Trickett MD; 04/17/2017 3:29 PM) The patient is a 66 year old female who presents with a breast mass. Referred by Dr. Burney Gauze Dr. Pamelia Hoit for right breast [facility]  This is a 65 year old female who initially went to see Dr. Marin Olp because of a very strong history of cancer in her family. She had not had a mammogram in 2 years. She has never had a colonoscopy. She was evaluated by oncology who sent her for mammogram. There were some questionable findings are resulted in an MRI which subsequently resulted in a biopsy by MR guidance. She had an area of linear enhancement in the lower outer quadrant. There was another right breast mass at 9:00 that was not visualized on the MRI at the time of biopsy. 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 A biopsy clip was placed. She is now referred for surgical evaluation.  The patient has had significant weight loss with no explanation for the last several months. She has dropped almost 30 pounds and it appears that she was fairly thin even before that time. She describes no attempts at weight loss and has not noticed any change in her appetite. She also describes vague abdominal pain that seems to be localized mostly in the right upper quadrant. This does not seem to be correlated with any type of food or even a postprandial occurrence. She denies any nausea vomiting or diarrhea. Sometimes the pain occurs when she is exercising. Sometimes the pain radiates through to her back.  She has been referred to genetic counseling but had to reschedule her appointment because of a family situation.  Menarche - age 38 First pregnancy - 25 Breastfeed - 3 months Hormones - none Menopause - late 51's   Family history Multiple relatives with breast, ovarian, and pancreatic cancers  We evaluated her abdominal symptoms with a CT scan  which showed only some mildly wall thickening in the gastric cardia consistent with gastritis. She was referred to GI for endoscopy. EGD biopsies were positive for H. Pylori. She has not started her H. Pylori treatment yet because of issues with obtaining the medication from the pharmacy. She has begun gaining back some wait and her abdominal symptoms seem to be significantly improved.  CLINICAL DATA: 66 y/o F; 66 y/o F; right upper quadrant pain and fever.  EXAM: CT ABDOMEN AND PELVIS WITH CONTRAST  TECHNIQUE: Multidetector CT imaging of the abdomen and pelvis was performed using the standard protocol following bolus administration of intravenous contrast.  CONTRAST: 136m ISOVUE-300 IOPAMIDOL (ISOVUE-300) INJECTION 61%  COMPARISON: None.  FINDINGS: Lower chest: No acute abnormality.  Hepatobiliary: Subcentimeter well-circumscribed lucencies in the right lobe of liver likely represent small cysts. No other focal liver abnormality. Normal gallbladder. No intra or extrahepatic biliary ductal dilatation.  Pancreas: Unremarkable. No pancreatic ductal dilatation or surrounding inflammatory changes.  Spleen: Normal in size without focal abnormality.  Adrenals/Urinary Tract: Right kidney interpolar lesion measuring 22 x 33 x 28 mm with multiple internal septations (series 2, image 28 and series 3, image 47).  Normal adrenal glands. Subcentimeter left kidney interpolar cyst. No hydronephrosis. Normal bladder.  Stomach/Bowel: Wall thickening within the gastric cardia and proximal body may represent gastritis. No obstructive or inflammatory changes of small and large bowel. Normal appendix.  Vascular/Lymphatic: Aortic atherosclerosis with moderate mixed plaque. No enlarged abdominal or pelvic lymph nodes.  Reproductive: 29 mm uterine myoma.  Other: No abdominal  wall hernia or abnormality. No abdominopelvic ascites.  Musculoskeletal: No acute or significant osseous  findings.  IMPRESSION: 1. Wall thickening within gastric cardia and proximal body may represent gastritis. 2. 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 Renal protocol CT or MRI is recommended at 6 and 12 months, then yearly for 5 years. This recommendation follows ACR consensus guidelines: Management of the Incidental Renal Mass on CT: A White Paper of the ACR Incidental Findings Committee. J Am Coll Radiol [phone]. 3. Aortic atherosclerosis. 4. Myomatous uterus.   Electronically Signed By: Kristine Garbe M.D. On: 02/22/2017 16:32   Diagnosis 1. Surgical [P], ileocecal valve erosions - BENIGN COLORECTAL-TYPE MUCOSA WITH LYMPHOID AGGREGATES. - THERE IS NO EVIDENCE OF COLITIS, DYSPLASIA OR MALIGNANCY. 2. Surgical [P], random sites - BENIGN COLONIC MUCOSA. - NO SIGNIFICANT INFLAMMATION OR OTHER ABNORMALITIES IDENTIFIED. 3. Surgical [P], duodenum bulb - PEPTIC DUODENITIS. - THERE IS NO EVIDENCE OF DYSPLASIA OR MALIGNANCY. 4. Surgical [P], distal stomach - CHRONIC GASTRITIS WITH HELICOBACTER PYLORI ORGANISMS. - THERE IS NO EVIDENCE OF DYSPLASIA OR MALIGNANCY. - SEE COMMENT. 5. Surgical [P], proximal stomach - CHRONIC GASTRITIS WITH HELICOBACTER PYLORI ORGANISMS. - THERE IS NO EVIDENCE OF DYSPLASIA OR MALIGNANCY. - SEE COMMENT. Microscopic Comment 4. Warthin Starry stains performed on parts 4 and 5 highlight the presence of Helicobacter pylori organisms. Enid Cutter MD Pathologist, Electronic Signature (Case signed 04/11/2017)     CLINICAL DATA: Screening.  EXAM: 2D DIGITAL SCREENING BILATERAL MAMMOGRAM WITH CAD AND ADJUNCT TOMO  COMPARISON: Previous exam(s).  ACR Breast Density Category b: There are scattered areas of fibroglandular density.  FINDINGS: In the right breast, a possible mass warrants further evaluation. In the left breast, no findings suspicious for  malignancy. Images were processed with CAD.  IMPRESSION: Further evaluation is suggested for possible mass in the right breast.  RECOMMENDATION: Diagnostic mammogram and possibly ultrasound of the right breast. (Code:[id number])  The patient will be contacted regarding the findings, and additional imaging will be scheduled.  BI-RADS CATEGORY 0: Incomplete. Need additional imaging evaluation and/or prior mammograms for comparison.   Electronically Signed By: Nolon Nations M.D. On: 10/05/2016 07:53  CLINICAL DATA: 66 year old female for evaluation of possible right breast mass on screening mammogram. Patient complains today of diffuse bilateral breast pain.  EXAM: 2D DIGITAL DIAGNOSTIC RIGHT MAMMOGRAM WITH ADJUNCT TOMO  ULTRASOUND RIGHT BREAST  COMPARISON: Previous exam(s).  ACR Breast Density Category b: There are scattered areas of fibroglandular density.  FINDINGS: 2D and 3D spot compression views of the right breast demonstrate a very faint circumscribed oval mass within the upper-outer right breast.  Targeted ultrasound is performed, showing a 4 x 3 x 6 mm circumscribed oval hypoechoic mass at the 10 o'clock position of the right breast 4 cm from the nipple likely representing the mammographic finding. This most likely represents fibrocystic changes or possibly a benign lymph node.  IMPRESSION: 6 mm likely benign mass in the upper-outer right breast. Six-month followup is recommended to ensure stability.  RECOMMENDATION: Right diagnostic mammogram with possible right breast ultrasound in 6 months.  I have discussed the findings, causes of and remedies for breast pain and recommendations with the patient. Results were also provided in writing at the conclusion of the visit. If applicable, a reminder letter will be sent to the patient regarding the next appointment.  BI-RADS CATEGORY 3: Probably benign.   Electronically Signed By: Margarette Canada  M.D. On: 10/12/2016 14:59  ADDENDUM REPORT: 02/13/2017  09:09 ADDENDUM: This is an addendum to an MRI he performed on 01/11/2017. In the report it stated that linear enhancement was seen in the upper-outer quadrant of the right breast but should have stated the linear enhancement was in the lower outer quadrant of the right breast. Electronically Signed By: Lillia Mountain M.D. On: 02/13/2017 09:09 Addended by Luvenia Redden, MD on 02/13/2017 9:12 AM  Study Result CLINICAL DATA: Family history of breast cancer in 2 grandmothers, 2 aunts and 2 sisters. Diffuse bilateral breast pain with discharge. Patient feels lumps under both arms. Probable benign lesion was seen in the right breast on a prior diagnostic study dated 10/12/2016. LABS: Creatinine was obtained on site at [place] at [address] Results: Creatinine 0.9 mg/dL. EXAM: BILATERAL BREAST MRI WITH AND WITHOUT CONTRAST TECHNIQUE: Multiplanar, multisequence MR images of both breasts were obtained prior to and following the intravenous administration of 10 ml of MultiHance. THREE-DIMENSIONAL MR IMAGE RENDERING ON INDEPENDENT WORKSTATION: Three-dimensional MR images were rendered by post-processing of the original MR data on an independent workstation. The three-dimensional MR images were interpreted, and findings are reported in the following complete MRI report for this study. Three dimensional images were evaluated at the independent DynaCad workstation COMPARISON: Previous exam(s). FINDINGS: Breast composition: b. Scattered fibroglandular tissue. Background parenchymal enhancement: Moderate. Right breast: In the lateral aspect of the anterior third of the right breast is an indeterminate 4 mm enhancing nodule. In upper outer quadrant of the far posterior aspect of the right breast there is linear enhancement spanning 1.6 cm. Ductal carcinoma in-situ could not be excluded. Adjacent and superolateral to the  linear enhancement in the upper-outer quadrant is an indeterminate 5 mm enhancing nodule. Left breast: No mass or abnormal enhancement. Lymph nodes: There is no enlarged axillary adenopathy. Ancillary findings: None. IMPRESSION: Linear enhancement in the far posterior aspect of the upper-outer quadrant of the right breast. Ductal carcinoma in-situ could not be excluded. Two enhancing nodules in the lateral (9 o'clock) area of the right breast and upper outer quadrant. RECOMMENDATION: MR guided core biopsies of the linear enhancement in the right breast as well as the nodule in the 9 o'clock region of the right breast is recommended. If the MR guided core biopsies are benign I would recommend a short-term interval follow-up MRI in 6 months. BI-RADS CATEGORY 4: Suspicious. Electronically Signed: By: Lillia Mountain M.D. On: 01/11/2017 13:00   CLINICAL DATA: 66 year old female for MRI guided biopsies of an indeterminate area of linear enhancement in the lower, outer right breast and of a right breast mass at 9 o'clock.  EXAM: MRI GUIDED CORE NEEDLE BIOPSY OF THE RIGHT BREAST  TECHNIQUE: Multiplanar, multisequence MR imaging of the right breast was performed both before and after administration of intravenous contrast.  CONTRAST: 12m MULTIHANCE GADOBENATE DIMEGLUMINE 529 MG/ML IV SOLN  COMPARISON: Previous exams.  FINDINGS: I met with the patient, and we discussed the procedure of MRI guided biopsy, including risks, benefits, and alternatives. Specifically, we discussed the risks of infection, bleeding, tissue injury, clip migration, and inadequate sampling. Informed, written consent was given. The usual time out protocol was performed immediately prior to the procedure.  Using sterile technique, 1% Lidocaine, MRI guidance, and a 9 gauge vacuum assisted device, biopsy was performed of the area of linear, non mass enhancement in the lower, outer right breast using a lateral  to medial approach. At the conclusion of the procedure, a dumbbell-shaped tissue marker clip was deployed into the biopsy cavity. Follow-up  2-view mammogram was performed and dictated separately.  The right breast mass at 9 o'clock described in the patient's MRI report from 01/11/2017 was not visualized on today's exam to allow for biopsy.  IMPRESSION: 1. MRI guided biopsy of linear, non mass enhancement in the lower, outer right breast. 2. The right breast mass at 9 o'clock described in the patient's MRI report from 01/11/2017 was not visualized on today's exam to allow for biopsy. A six-month follow-up bilateral breast MRI with contrast is recommended for follow-up of the mass at 9 o'clock and of an additional mass described in the lower, outer right breast on recent MRI.  Electronically Signed: By: Pamelia Hoit M.D. On: 01/26/2017 10:02  ADDENDUM REPORT: 01/27/2017 14:31  ADDENDUM: Pathology revealed ATYPICAL LOBULAR HYPERPLASIA WITH CALCIFICATIONS, SCLEROSING PAPILLARY LESION of the Right breast, lower outer quadrant. This was found to be concordant by Dr. Pamelia Hoit, with excision recommended. Pathology results were discussed with the patient by telephone. The patient reported doing well after the biopsy with tenderness at the site. Post biopsy instructions and care were reviewed and questions were answered. The patient was encouraged to call [facility] for any additional concerns. Surgical consultation has been arranged with Dr. Donnie Mesa at [facility], at the request of Dr. Burney Gauze, on February 13, 2017.  Pathology results reported by Terie Purser, RN on 01/27/2017.   Electronically Signed By: Pamelia Hoit M.D. On: 01/27/2017 14:31  CLINICAL DATA: Status post MRI guided right breast biopsy  EXAM: DIAGNOSTIC RIGHT MAMMOGRAM POST MRI BIOPSY  COMPARISON: Previous exam(s).  FINDINGS: Mammographic images were obtained  following MRI guided biopsy of an indeterminate area of linear, non mass enhancement in the lower, outer right breast. Post biopsy mammogram demonstrates the dumbbell-shaped biopsy marker to be in the expected location within the lower, outer right breast, posterior depth.  IMPRESSION: Appropriate marker position as above.  Final Assessment: Post Procedure Mammograms for Marker Placement   Electronically Signed By: Pamelia Hoit M.D. On: 01/26/2017 10:03   Problem List/Past Medical Rodman Key K. 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 Violanda Bobeck, MD; 04/17/2017 3:30 PM) Breast Biopsy Bilateral. multiple Breast Mass; Local Excision Left. Oral Surgery  Diagnostic Studies History Deanna Reynolds. Gad Aymond, MD; 04/17/2017 3:30 PM) Colonoscopy never Mammogram within last year Pap Smear 1-5 years ago  Allergies Rodman Key K. Jacquez Sheetz, MD; 04/17/2017 3:30 PM) Ibuprofen *ANALGESICS - ANTI-INFLAMMATORY* Nausea, Vomiting.  Social History Deanna Reynolds. Albert Hersch, MD; 04/17/2017 3:30 PM) Alcohol use Occasional alcohol use. Caffeine use Coffee, Tea. No drug use Tobacco use Former smoker.  Family History Deanna Reynolds. Reyaansh Merlo, MD; 04/17/2017 3:30 PM) Alcohol Abuse Brother, Family Members In General, Father, Mother. Arthritis Brother, Mother, Sister. Breast Cancer Family Members In General, Sister. Cerebrovascular Accident Mother. Cervical Cancer Family Members In General, Sister. Depression Daughter. Diabetes Mellitus Family Members In General, Mother. Heart Disease Family Members In General, Mother. Heart disease in female family member before age 38 Hypertension Mother. Migraine Headache Father, Sister. Ovarian Cancer Family Members In General. Respiratory Condition Mother. Seizure disorder Brother. Thyroid problems  Sister.  Pregnancy / Birth History Deanna Reynolds. Rosina Cressler, MD; 04/17/2017 3:30 PM) Age at menarche 47 years. Age of menopause 72-55 Gravida 1 Irregular periods Length (months) of breastfeeding 3-6 Maternal age 87-25 Para 8  Other Problems Deanna Reynolds. Marveline Profeta, MD; 04/17/2017 3:30 PM) Arthritis Back Pain Depression Heart murmur Hemorrhoids  Lump In Breast Migraine Headache Oophorectomy Left.    Vitals (Janette Ranson CMA; 04/17/2017 10:57 AM) 04/17/2017 10:57 AM Weight: 119 lb Height: 61in Body Surface Area: 1.52 m Body Mass Index: 22.48 kg/m  Pulse: 77 (Regular)  BP: 120/72 (Sitting, Left Arm, Standard)      Physical Exam Rodman Key K. Elisia Stepp MD; 04/17/2017 3:30 PM)  The physical exam findings are as follows: Note:WDWN in NAD Eyes: Pupils equal, round; sclera anicteric HENT: Oral mucosa moist; good dentition Neck: No masses palpated, no thyromegaly Breasts: symmetric; bruising has resolved; no other dominant masses; no axillary lymphadenopathy; no nipple retraction or discharge Lungs: CTA bilaterally; normal respiratory effort CV: Regular rate and rhythm; no murmurs; extremities well-perfused with no edema Abd: +bowel sounds, soft, mild diffuse tenderness; minimally tender in RUQ, no palpable organomegaly; no palpable hernias Skin: Warm, dry; no sign of jaundice Psychiatric - alert and oriented x 4; calm mood and affect    Assessment & Plan Rodman Key K. 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 The surgical procedure has been discussed with the patient. Potential risks, benefits, alternative treatments, and expected outcomes have been explained. All of the patient's questions at this time have been answered. The likelihood of reaching the patient's treatment goal is good. The patient understand the proposed surgical  procedure and wishes to proceed.  Deanna Reynolds. Georgette Dover, MD, [facility]  General/ Trauma Surgery  04/17/2017 3:30 PM

## 2017-04-19 ENCOUNTER — Other Ambulatory Visit: Payer: Self-pay | Admitting: Surgery

## 2017-04-19 ENCOUNTER — Encounter: Payer: Medicare HMO | Admitting: Gastroenterology

## 2017-04-19 ENCOUNTER — Encounter (HOSPITAL_BASED_OUTPATIENT_CLINIC_OR_DEPARTMENT_OTHER): Payer: Self-pay | Admitting: *Deleted

## 2017-04-19 DIAGNOSIS — N6091 Unspecified benign mammary dysplasia of right breast: Secondary | ICD-10-CM

## 2017-04-21 ENCOUNTER — Encounter (HOSPITAL_BASED_OUTPATIENT_CLINIC_OR_DEPARTMENT_OTHER)
Admission: RE | Admit: 2017-04-21 | Discharge: 2017-04-21 | Disposition: A | Discharge status: Home / Self Care | Payer: Medicare HMO | Source: Ambulatory Visit | Attending: Surgery | Admitting: Surgery

## 2017-04-21 DIAGNOSIS — N6091 Unspecified benign mammary dysplasia of right breast: Secondary | ICD-10-CM | POA: Insufficient documentation

## 2017-04-21 DIAGNOSIS — R9431 Abnormal electrocardiogram [ECG] [EKG]: Secondary | ICD-10-CM | POA: Insufficient documentation

## 2017-04-21 DIAGNOSIS — Z01810 Encounter for preprocedural cardiovascular examination: Secondary | ICD-10-CM | POA: Diagnosis not present

## 2017-04-21 DIAGNOSIS — Z01812 Encounter for preprocedural laboratory examination: Secondary | ICD-10-CM | POA: Insufficient documentation

## 2017-04-21 LAB — BASIC METABOLIC PANEL
ANION GAP: 8 (ref 5–15)
BUN: 10 mg/dL (ref 6–20)
CHLORIDE: 105 mmol/L (ref 101–111)
CO2: 25 mmol/L (ref 22–32)
Calcium: 9.5 mg/dL (ref 8.9–10.3)
Creatinine, Ser: 0.86 mg/dL (ref 0.44–1.00)
GFR calc Af Amer: >60 mL/min (ref >60)
Glucose, Bld: 88 mg/dL (ref 65–99)
POTASSIUM: 3.7 mmol/L (ref 3.5–5.1)
SODIUM: 138 mmol/L (ref 135–145)

## 2017-04-21 NOTE — Progress Notes (Signed)
EKG reviewed by Dr. Marcell Barlow, will proceed with surgery as scheduled. Ensure pre-surgery drink given with instructions to complete by [facility], pt verbalized understanding.

## 2017-04-25 ENCOUNTER — Ambulatory Visit
Admission: RE | Admit: 2017-04-25 | Discharge: 2017-04-25 | Disposition: A | Discharge status: Home / Self Care | Payer: Medicare HMO | Source: Ambulatory Visit | Attending: Surgery | Admitting: Surgery

## 2017-04-25 DIAGNOSIS — N6091 Unspecified benign mammary dysplasia of right breast: Secondary | ICD-10-CM

## 2017-04-26 ENCOUNTER — Ambulatory Visit (HOSPITAL_BASED_OUTPATIENT_CLINIC_OR_DEPARTMENT_OTHER): Payer: Medicare HMO | Admitting: Anesthesiology

## 2017-04-26 ENCOUNTER — Encounter (HOSPITAL_BASED_OUTPATIENT_CLINIC_OR_DEPARTMENT_OTHER)
Admission: RE | Disposition: A | Discharge status: Home / Self Care | Payer: Self-pay | Source: Ambulatory Visit | Attending: Surgery

## 2017-04-26 ENCOUNTER — Encounter (HOSPITAL_BASED_OUTPATIENT_CLINIC_OR_DEPARTMENT_OTHER): Payer: Self-pay | Admitting: *Deleted

## 2017-04-26 ENCOUNTER — Ambulatory Visit
Admission: RE | Admit: 2017-04-26 | Discharge: 2017-04-26 | Disposition: A | Discharge status: Home / Self Care | Payer: Medicare HMO | Source: Ambulatory Visit | Attending: Surgery | Admitting: Surgery

## 2017-04-26 ENCOUNTER — Ambulatory Visit (HOSPITAL_BASED_OUTPATIENT_CLINIC_OR_DEPARTMENT_OTHER)
Admission: RE | Admit: 2017-04-26 | Discharge: 2017-04-26 | Disposition: A | Discharge status: Home / Self Care | Payer: Medicare HMO | Source: Ambulatory Visit | Attending: Surgery | Admitting: Surgery

## 2017-04-26 DIAGNOSIS — Z823 Family history of stroke: Secondary | ICD-10-CM | POA: Insufficient documentation

## 2017-04-26 DIAGNOSIS — Z8049 Family history of malignant neoplasm of other genital organs: Secondary | ICD-10-CM | POA: Insufficient documentation

## 2017-04-26 DIAGNOSIS — R634 Abnormal weight loss: Secondary | ICD-10-CM | POA: Diagnosis not present

## 2017-04-26 DIAGNOSIS — Z811 Family history of alcohol abuse and dependence: Secondary | ICD-10-CM | POA: Diagnosis not present

## 2017-04-26 DIAGNOSIS — N6091 Unspecified benign mammary dysplasia of right breast: Secondary | ICD-10-CM

## 2017-04-26 DIAGNOSIS — R011 Cardiac murmur, unspecified: Secondary | ICD-10-CM | POA: Insufficient documentation

## 2017-04-26 DIAGNOSIS — Z820 Family history of epilepsy and other diseases of the nervous system: Secondary | ICD-10-CM | POA: Insufficient documentation

## 2017-04-26 DIAGNOSIS — Z8349 Family history of other endocrine, nutritional and metabolic diseases: Secondary | ICD-10-CM | POA: Insufficient documentation

## 2017-04-26 DIAGNOSIS — Z886 Allergy status to analgesic agent status: Secondary | ICD-10-CM | POA: Insufficient documentation

## 2017-04-26 DIAGNOSIS — N6021 Fibroadenosis of right breast: Secondary | ICD-10-CM | POA: Insufficient documentation

## 2017-04-26 DIAGNOSIS — Z8261 Family history of arthritis: Secondary | ICD-10-CM | POA: Insufficient documentation

## 2017-04-26 DIAGNOSIS — Z818 Family history of other mental and behavioral disorders: Secondary | ICD-10-CM | POA: Diagnosis not present

## 2017-04-26 DIAGNOSIS — Z803 Family history of malignant neoplasm of breast: Secondary | ICD-10-CM | POA: Insufficient documentation

## 2017-04-26 DIAGNOSIS — N644 Mastodynia: Secondary | ICD-10-CM | POA: Insufficient documentation

## 2017-04-26 DIAGNOSIS — M1990 Unspecified osteoarthritis, unspecified site: Secondary | ICD-10-CM | POA: Insufficient documentation

## 2017-04-26 DIAGNOSIS — C50511 Malignant neoplasm of lower-outer quadrant of right female breast: Secondary | ICD-10-CM | POA: Insufficient documentation

## 2017-04-26 DIAGNOSIS — Z8249 Family history of ischemic heart disease and other diseases of the circulatory system: Secondary | ICD-10-CM | POA: Insufficient documentation

## 2017-04-26 DIAGNOSIS — G43909 Migraine, unspecified, not intractable, without status migrainosus: Secondary | ICD-10-CM | POA: Diagnosis not present

## 2017-04-26 DIAGNOSIS — N2889 Other specified disorders of kidney and ureter: Secondary | ICD-10-CM | POA: Diagnosis not present

## 2017-04-26 DIAGNOSIS — M549 Dorsalgia, unspecified: Secondary | ICD-10-CM | POA: Diagnosis not present

## 2017-04-26 DIAGNOSIS — Z87891 Personal history of nicotine dependence: Secondary | ICD-10-CM | POA: Insufficient documentation

## 2017-04-26 DIAGNOSIS — I700 Atherosclerosis of aorta: Secondary | ICD-10-CM | POA: Diagnosis not present

## 2017-04-26 DIAGNOSIS — Z8041 Family history of malignant neoplasm of ovary: Secondary | ICD-10-CM | POA: Diagnosis not present

## 2017-04-26 DIAGNOSIS — Z170 Estrogen receptor positive status [ER+]: Secondary | ICD-10-CM | POA: Diagnosis not present

## 2017-04-26 DIAGNOSIS — N62 Hypertrophy of breast: Secondary | ICD-10-CM | POA: Diagnosis present

## 2017-04-26 DIAGNOSIS — Z6820 Body mass index (BMI) 20.0-20.9, adult: Secondary | ICD-10-CM | POA: Insufficient documentation

## 2017-04-26 DIAGNOSIS — D259 Leiomyoma of uterus, unspecified: Secondary | ICD-10-CM | POA: Insufficient documentation

## 2017-04-26 DIAGNOSIS — F329 Major depressive disorder, single episode, unspecified: Secondary | ICD-10-CM | POA: Diagnosis not present

## 2017-04-26 HISTORY — DX: Gastritis, unspecified, without bleeding: K29.70

## 2017-04-26 HISTORY — PX: BREAST LUMPECTOMY WITH RADIOACTIVE SEED LOCALIZATION: SHX6424

## 2017-04-26 SURGERY — BREAST LUMPECTOMY WITH RADIOACTIVE SEED LOCALIZATION
Anesthesia: General | Site: Breast | Laterality: Right

## 2017-04-26 MED ORDER — OXYCODONE HCL 5 MG/5ML PO SOLN: 5.0000 mg | Freq: Once | ORAL | Status: DC | PRN

## 2017-04-26 MED ORDER — GABAPENTIN 300 MG PO CAPS
ORAL_CAPSULE | ORAL | Status: AC
  Filled 2017-04-26: qty 1

## 2017-04-26 MED ORDER — GABAPENTIN 300 MG PO CAPS
300.0000 mg | ORAL_CAPSULE | ORAL | Status: AC
  Administered 2017-04-26: 300 mg via ORAL

## 2017-04-26 MED ORDER — ACETAMINOPHEN 500 MG PO TABS
ORAL_TABLET | ORAL | Status: AC
  Filled 2017-04-26: qty 2

## 2017-04-26 MED ORDER — LIDOCAINE 2% (20 MG/ML) 5 ML SYRINGE
INTRAMUSCULAR | Status: DC | PRN
  Administered 2017-04-26: 60 mg via INTRAVENOUS

## 2017-04-26 MED ORDER — BUPIVACAINE-EPINEPHRINE 0.25% -1:200000 IJ SOLN
INTRAMUSCULAR | Status: DC | PRN
  Administered 2017-04-26: 10 mL

## 2017-04-26 MED ORDER — FENTANYL CITRATE (PF) 100 MCG/2ML IJ SOLN
INTRAMUSCULAR | Status: AC
  Filled 2017-04-26: qty 2

## 2017-04-26 MED ORDER — EPHEDRINE SULFATE 50 MG/ML IJ SOLN
INTRAMUSCULAR | Status: DC | PRN
  Administered 2017-04-26: 5 mg via INTRAVENOUS
  Administered 2017-04-26: 10 mg via INTRAVENOUS
  Administered 2017-04-26: 5 mg via INTRAVENOUS

## 2017-04-26 MED ORDER — ONDANSETRON HCL 4 MG/2ML IJ SOLN: 4.0000 mg | Freq: Four times a day (QID) | INTRAMUSCULAR | Status: DC | PRN

## 2017-04-26 MED ORDER — SCOPOLAMINE 1 MG/3DAYS TD PT72: 1.0000 | MEDICATED_PATCH | Freq: Once | TRANSDERMAL | Status: DC | PRN

## 2017-04-26 MED ORDER — MIDAZOLAM HCL 2 MG/2ML IJ SOLN
1.0000 mg | INTRAMUSCULAR | Status: DC | PRN
  Administered 2017-04-26: 2 mg via INTRAVENOUS

## 2017-04-26 MED ORDER — ONDANSETRON HCL 4 MG/2ML IJ SOLN
INTRAMUSCULAR | Status: AC
  Filled 2017-04-26: qty 2

## 2017-04-26 MED ORDER — CHLORHEXIDINE GLUCONATE CLOTH 2 % EX PADS: 6.0000 | MEDICATED_PAD | Freq: Once | CUTANEOUS | Status: DC

## 2017-04-26 MED ORDER — ACETAMINOPHEN 500 MG PO TABS
1000.0000 mg | ORAL_TABLET | ORAL | Status: AC
  Administered 2017-04-26: 1000 mg via ORAL

## 2017-04-26 MED ORDER — OXYCODONE HCL 5 MG PO TABS: 5.0000 mg | ORAL_TABLET | Freq: Once | ORAL | Status: DC | PRN

## 2017-04-26 MED ORDER — CEFAZOLIN SODIUM-DEXTROSE 2-4 GM/100ML-% IV SOLN
INTRAVENOUS | Status: AC
  Filled 2017-04-26: qty 100

## 2017-04-26 MED ORDER — PROPOFOL 10 MG/ML IV BOLUS
INTRAVENOUS | Status: DC | PRN
Start: 2017-04-26 — End: 2017-04-26
  Administered 2017-04-26: 120 mg via INTRAVENOUS

## 2017-04-26 MED ORDER — LACTATED RINGERS IV SOLN
INTRAVENOUS | Status: DC
  Administered 2017-04-26: 12:00:00 via INTRAVENOUS

## 2017-04-26 MED ORDER — MIDAZOLAM HCL 2 MG/2ML IJ SOLN
INTRAMUSCULAR | Status: AC
  Filled 2017-04-26: qty 2

## 2017-04-26 MED ORDER — CEFAZOLIN SODIUM-DEXTROSE 2-4 GM/100ML-% IV SOLN
2.0000 g | INTRAVENOUS | Status: AC
  Administered 2017-04-26: 2 g via INTRAVENOUS

## 2017-04-26 MED ORDER — FENTANYL CITRATE (PF) 100 MCG/2ML IJ SOLN
25.0000 ug | INTRAMUSCULAR | Status: DC | PRN
  Administered 2017-04-26: 25 ug via INTRAVENOUS
  Administered 2017-04-26: 50 ug via INTRAVENOUS
  Administered 2017-04-26 (×3): 25 ug via INTRAVENOUS

## 2017-04-26 MED ORDER — ONDANSETRON HCL 4 MG/2ML IJ SOLN
INTRAMUSCULAR | Status: DC | PRN
  Administered 2017-04-26: 4 mg via INTRAVENOUS

## 2017-04-26 MED ORDER — HYDROCODONE-ACETAMINOPHEN 5-325 MG PO TABS: 1.0000 | ORAL_TABLET | Freq: Four times a day (QID) | ORAL | 0 refills | Status: DC | PRN

## 2017-04-26 MED ORDER — FENTANYL CITRATE (PF) 100 MCG/2ML IJ SOLN
50.0000 ug | INTRAMUSCULAR | Status: AC | PRN
  Administered 2017-04-26 (×4): 25 ug via INTRAVENOUS

## 2017-04-26 SURGICAL SUPPLY — 54 items
APPLIER CLIP 9.375 MED OPEN (MISCELLANEOUS)
BENZOIN TINCTURE PRP APPL 2/3 (GAUZE/BANDAGES/DRESSINGS) ×3 IMPLANT
BLADE HEX COATED 2.75 (ELECTRODE) ×3 IMPLANT
BLADE SURG 15 STRL LF DISP TIS (BLADE) ×1 IMPLANT
BLADE SURG 15 STRL SS (BLADE) ×2
CANISTER SUC SOCK COL 7IN (MISCELLANEOUS) IMPLANT
CANISTER SUCT 1200ML W/VALVE (MISCELLANEOUS) ×3 IMPLANT
CHLORAPREP W/TINT 26ML (MISCELLANEOUS) ×3 IMPLANT
CLIP APPLIE 9.375 MED OPEN (MISCELLANEOUS) IMPLANT
CLOSURE WOUND 1/2 X4 (GAUZE/BANDAGES/DRESSINGS) ×1
COVER BACK TABLE 60X90IN (DRAPES) ×3 IMPLANT
COVER MAYO STAND STRL (DRAPES) ×3 IMPLANT
COVER PROBE W GEL 5X96 (DRAPES) IMPLANT
DECANTER SPIKE VIAL GLASS SM (MISCELLANEOUS) IMPLANT
DEVICE DUBIN W/COMP PLATE 8390 (MISCELLANEOUS) ×3 IMPLANT
DRAPE LAPAROTOMY 100X72 PEDS (DRAPES) ×3 IMPLANT
DRAPE UTILITY XL STRL (DRAPES) ×3 IMPLANT
DRSG TEGADERM 4X4.75 (GAUZE/BANDAGES/DRESSINGS) ×3 IMPLANT
ELECT REM PT RETURN 9FT ADLT (ELECTROSURGICAL) ×3
ELECTRODE REM PT RTRN 9FT ADLT (ELECTROSURGICAL) ×1 IMPLANT
GAUZE SPONGE 4X4 12PLY STRL LF (GAUZE/BANDAGES/DRESSINGS) IMPLANT
GLOVE BIO SURGEON STRL SZ 6.5 (GLOVE) ×2 IMPLANT
GLOVE BIO SURGEON STRL SZ7 (GLOVE) ×3 IMPLANT
GLOVE BIO SURGEONS STRL SZ 6.5 (GLOVE) ×1
GLOVE BIOGEL PI IND STRL 7.0 (GLOVE) ×1 IMPLANT
GLOVE BIOGEL PI IND STRL 7.5 (GLOVE) ×1 IMPLANT
GLOVE BIOGEL PI INDICATOR 7.0 (GLOVE) ×2
GLOVE BIOGEL PI INDICATOR 7.5 (GLOVE) ×2
GOWN STRL REUS W/ TWL LRG LVL3 (GOWN DISPOSABLE) ×2 IMPLANT
GOWN STRL REUS W/TWL LRG LVL3 (GOWN DISPOSABLE) ×4
ILLUMINATOR WAVEGUIDE N/F (MISCELLANEOUS) IMPLANT
KIT MARKER MARGIN INK (KITS) ×3 IMPLANT
LIGHT WAVEGUIDE WIDE FLAT (MISCELLANEOUS) IMPLANT
NEEDLE HYPO 25X1 1.5 SAFETY (NEEDLE) ×3 IMPLANT
NS IRRIG 1000ML POUR BTL (IV SOLUTION) ×3 IMPLANT
PACK BASIN DAY SURGERY FS (CUSTOM PROCEDURE TRAY) ×3 IMPLANT
PENCIL BUTTON HOLSTER BLD 10FT (ELECTRODE) ×3 IMPLANT
SLEEVE SCD COMPRESS KNEE MED (MISCELLANEOUS) ×3 IMPLANT
SPONGE GAUZE 2X2 8PLY STER LF (GAUZE/BANDAGES/DRESSINGS)
SPONGE GAUZE 2X2 8PLY STRL LF (GAUZE/BANDAGES/DRESSINGS) IMPLANT
SPONGE LAP 18X18 X RAY DECT (DISPOSABLE) IMPLANT
SPONGE LAP 4X18 X RAY DECT (DISPOSABLE) ×3 IMPLANT
STRIP CLOSURE SKIN 1/2X4 (GAUZE/BANDAGES/DRESSINGS) ×2 IMPLANT
SUT MON AB 4-0 PC3 18 (SUTURE) ×3 IMPLANT
SUT SILK 2 0 SH (SUTURE) IMPLANT
SUT VIC AB 3-0 SH 27 (SUTURE) ×2
SUT VIC AB 3-0 SH 27X BRD (SUTURE) ×1 IMPLANT
SYR BULB 3OZ (MISCELLANEOUS) ×3 IMPLANT
SYR CONTROL 10ML LL (SYRINGE) ×3 IMPLANT
TOWEL OR 17X24 6PK STRL BLUE (TOWEL DISPOSABLE) ×3 IMPLANT
TOWEL OR NON WOVEN STRL DISP B (DISPOSABLE) ×3 IMPLANT
TUBE CONNECTING 20'X1/4 (TUBING) ×1
TUBE CONNECTING 20X1/4 (TUBING) ×2 IMPLANT
YANKAUER SUCT BULB TIP NO VENT (SUCTIONS) ×3 IMPLANT

## 2017-04-26 NOTE — Transfer of Care (Signed)
Immediate Anesthesia Transfer of Care Note  Patient: Quarry manager  Procedure(s) Performed: Procedure(s): RIGHT BREAST LUMPECTOMY WITH RADIOACTIVE SEED LOCALIZATION ERAS PATHWAY (Right)  Patient Location: PACU  Anesthesia Type:General  Level of Consciousness: awake, alert  and oriented  Airway & Oxygen Therapy: Patient Spontanous Breathing  Post-op Assessment: Report given to RN and Post -op Vital signs reviewed and stable  Post vital signs: Reviewed  Last Vitals:  Vitals:   04/26/17 1146  BP: (!) 147/95  Pulse: 88  Resp: 16  Temp: 36.8 C  SpO2: 100%    Last Pain:  Vitals:   04/26/17 1146  TempSrc: Oral  PainSc: 5       Patients Stated Pain Goal: 1 (54/27/06 2376)  Complications: No apparent anesthesia complications

## 2017-04-26 NOTE — Anesthesia Preprocedure Evaluation (Signed)
Anesthesia Evaluation  Patient identified by MRN, date of birth, ID band Patient awake    Reviewed: Allergy & Precautions, H&P , NPO status , Patient's Chart, lab work & pertinent test results  Airway Mallampati: II   Neck ROM: full    Dental   Pulmonary former smoker,    breath sounds clear to auscultation       Cardiovascular hypertension,  Rhythm:regular Rate:Normal     Neuro/Psych PSYCHIATRIC DISORDERS Depression  Neuromuscular disease    GI/Hepatic GERD  ,  Endo/Other    Renal/GU      Musculoskeletal  (+) Arthritis ,   Abdominal   Peds  Hematology   Anesthesia Other Findings   Reproductive/Obstetrics                             Anesthesia Physical Anesthesia Plan  ASA: II  Anesthesia Plan: General   Post-op Pain Management:    Induction: Intravenous  PONV Risk Score and Plan: 3 and Ondansetron, Dexamethasone, Midazolam and Treatment may vary due to age or medical condition  Airway Management Planned: LMA  Additional Equipment:   Intra-op Plan:   Post-operative Plan:   Informed Consent: I have reviewed the patients History and Physical, chart, labs and discussed the procedure including the risks, benefits and alternatives for the proposed anesthesia with the patient or authorized representative who has indicated his/her understanding and acceptance.     Plan Discussed with: CRNA, Anesthesiologist and Surgeon  Anesthesia Plan Comments:         Anesthesia Quick Evaluation

## 2017-04-26 NOTE — Discharge Instructions (Signed)
[facility] °Office Phone Number [phone] ° °BREAST BIOPSY/ PARTIAL MASTECTOMY: POST OP INSTRUCTIONS ° °Always review your discharge instruction sheet given to you by the facility where your surgery was performed. ° °IF YOU HAVE DISABILITY OR FAMILY LEAVE FORMS, YOU MUST BRING THEM TO THE OFFICE FOR PROCESSING.  DO NOT GIVE THEM TO YOUR DOCTOR. ° °1. A prescription for pain medication may be given to you upon discharge.  Take your pain medication as prescribed, if needed.  If narcotic pain medicine is not needed, then you may take acetaminophen (Tylenol) or ibuprofen (Advil) as needed. °2. Take your usually prescribed medications unless otherwise directed °3. If you need a refill on your pain medication, please contact your pharmacy.  They will contact our office to request authorization.  Prescriptions will not be filled after 5pm or on week-ends. °4. You should eat very light the first 24 hours after surgery, such as soup, crackers, pudding, etc.  Resume your normal diet the day after surgery. °5. Most patients will experience some swelling and bruising in the breast.  Ice packs and a good support bra will help.  Swelling and bruising can take several days to resolve.  °6. It is common to experience some constipation if taking pain medication after surgery.  Increasing fluid intake and taking a stool softener will usually help or prevent this problem from occurring.  A mild laxative (Milk of Magnesia or Miralax) should be taken according to package directions if there are no bowel movements after 48 hours. °7. Unless discharge instructions indicate otherwise, you may remove your bandages 24-48 hours after surgery, and you may shower at that time.  You may have steri-strips (small skin tapes) in place directly over the incision.  These strips should be left on the skin for 7-10 days.  If your surgeon used skin glue on the incision, you may shower in 24 hours.  The glue will flake off over the  next 2-3 weeks.  Any sutures or staples will be removed at the office during your follow-up visit. °8. ACTIVITIES:  You may resume regular daily activities (gradually increasing) beginning the next day.  Wearing a good support bra or sports bra minimizes pain and swelling.  You may have sexual intercourse when it is comfortable. °a. You may drive when you no longer are taking prescription pain medication, you can comfortably wear a seatbelt, and you can safely maneuver your car and apply brakes. °b. RETURN TO WORK:  ______________________________________________________________________________________ °9. You should see your doctor in the office for a follow-up appointment approximately two weeks after your surgery.  Your doctor’s nurse will typically make your follow-up appointment when she calls you with your pathology report.  Expect your pathology report 2-3 business days after your surgery.  You may call to check if you do not hear from us after three days. °10. OTHER INSTRUCTIONS: _______________________________________________________________________________________________ _____________________________________________________________________________________________________________________________________ °_____________________________________________________________________________________________________________________________________ °_____________________________________________________________________________________________________________________________________ ° °WHEN TO CALL YOUR DOCTOR: °1. Fever over 101.0 °2. Nausea and/or vomiting. °3. Extreme swelling or bruising. °4. Continued bleeding from incision. °5. Increased pain, redness, or drainage from the incision. ° °The clinic staff is available to answer your questions during regular business hours.  Please don’t hesitate to call and ask to speak to one of the nurses for clinical concerns.  If you have a medical emergency, go to the nearest  emergency room or call 911.  A surgeon from [facility] is always on call at the hospital. ° °For further questions, please visit [email or web address]  ° ° ° ° °  Post Anesthesia Home Care Instructions ° °Activity: °Get plenty of rest for the remainder of the day. A responsible individual must stay with you for 24 hours following the procedure.  °For the next 24 hours, DO NOT: °-Drive a car °-Operate machinery °-Drink alcoholic beverages °-Take any medication unless instructed by your physician °-Make any legal decisions or sign important papers. ° °Meals: °Start with liquid foods such as gelatin or soup. Progress to regular foods as tolerated. Avoid greasy, spicy, heavy foods. If nausea and/or vomiting occur, drink only clear liquids until the nausea and/or vomiting subsides. Call your physician if vomiting continues. ° °Special Instructions/Symptoms: °Your throat may feel dry or sore from the anesthesia or the breathing tube placed in your throat during surgery. If this causes discomfort, gargle with warm salt water. The discomfort should disappear within 24 hours. ° °If you had a scopolamine patch placed behind your ear for the management of post- operative nausea and/or vomiting: ° °1. The medication in the patch is effective for 72 hours, after which it should be removed.  Wrap patch in a tissue and discard in the trash. Wash hands thoroughly with soap and water. °2. You may remove the patch earlier than 72 hours if you experience unpleasant side effects which may include dry mouth, dizziness or visual disturbances. °3. Avoid touching the patch. Wash your hands with soap and water after contact with the patch. °  ° °

## 2017-04-26 NOTE — Interval H&P Note (Signed)
History and Physical Interval Note:  04/26/2017 12:38 PM  Deanna Reynolds  has presented today for surgery, with the diagnosis of Right breast atypical lobular hyperplasia  The various methods of treatment have been discussed with the patient and family. After consideration of risks, benefits and other options for treatment, the patient has consented to  Procedure(s): RIGHT BREAST LUMPECTOMY WITH RADIOACTIVE SEED LOCALIZATION ERAS PATHWAY (Right) as a surgical intervention .  The patient's history has been reviewed, patient examined, no change in status, stable for surgery.  I have reviewed the patient's chart and labs.  Questions were answered to the patient's satisfaction.     Aryam Zhan K.

## 2017-04-26 NOTE — Anesthesia Postprocedure Evaluation (Signed)
Anesthesia Post Note  Patient: Quarry manager  Procedure(s) Performed: Procedure(s) (LRB): RIGHT BREAST LUMPECTOMY WITH RADIOACTIVE SEED LOCALIZATION ERAS PATHWAY (Right)     Patient location during evaluation: PACU Anesthesia Type: General Level of consciousness: awake and alert Pain management: pain level controlled Vital Signs Assessment: post-procedure vital signs reviewed and stable Respiratory status: spontaneous breathing, nonlabored ventilation, respiratory function stable and patient connected to nasal cannula oxygen Cardiovascular status: blood pressure returned to baseline and stable Postop Assessment: no apparent nausea or vomiting Anesthetic complications: no    Last Vitals:  Vitals:   04/26/17 1541 04/26/17 1600  BP: (!) 156/70 139/75  Pulse: 83 76  Resp: 16 16  Temp:  36.9 C  SpO2: 100% 100%    Last Pain:  Vitals:   04/26/17 1600  TempSrc:   PainSc: 2                  Bryanna Yim S

## 2017-04-26 NOTE — Op Note (Addendum)
Pre-op Diagnosis:  Right breast atypical lobular hyperplasia Post-op Diagnosis: same Procedure: Right radioactive seed localized lumpectomy Surgeon:  Mima Cranmore K. Anesthesia:  GEN - LMA Indications:  This is a 66 year old female who initially went to see Dr. Marin Olp because of a very strong history of cancer in her family. She had not had a mammogram in 2 years. She has never had a colonoscopy. She was evaluated by oncology who sent her for mammogram. There were some questionable findings are resulted in an MRI which subsequently resulted in a biopsy by MR guidance. She had an area of linear enhancement in the lower outer quadrant. There was another right breast mass at 9:00 that was not visualized on the MRI at the time of biopsy. 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 A biopsy clip was placed.   Description of procedure: The patient is brought to the operating room placed in supine position on the operating room table. After an adequate level of general anesthesia was obtained, her right breast was prepped with ChloraPrep and draped sterile fashion. A timeout was taken to ensure the proper patient and proper procedure. We interrogated the breast with the neoprobe. We made a circumareolar incision around the lower side of the nipple after infiltrating with 0.25% Marcaine. Dissection was carried down in the breast tissue with cautery. We used the neoprobe to guide [place] towards the radioactive seed. We excised an area of tissue around the radioactive seed 2 cm in diameter.  The specimen was fairly superficial and we inadvertently "buttonholed" the skin in the right inframammary region.   The specimen was removed and was oriented with a paint kit. Specimen mammogram showed the radioactive seed as well as the biopsy clip within the specimen. This was sent for pathologic examination. There is no residual  radioactivity within the biopsy cavity. We inspected carefully for hemostasis. The wound was thoroughly irrigated. I excised the skin around the button hole and closed with 3-0 Vicryl and 4-0 Monocryl.  The wound was closed with a deep layer of 3-0 Vicryl and a subcuticular layer of 4-0 Monocryl. Benzoin Steri-Strips were applied. The patient was then extubated and brought to the recovery room in stable condition. All sponge, instrument, and needle counts are correct.  Imogene Burn. Georgette Dover, MD, [facility]  General/ Trauma Surgery  04/26/2017 2:25 PM

## 2017-04-26 NOTE — H&P (View-Only) (Signed)
History of Present Illness Deanna Reynolds. Deanna Fernandez MD; 04/17/2017 3:29 PM) The patient is a 65 year old female who presents with a breast mass. Referred by Dr. Burney Gauze Dr. Pamelia Hoit for right breast [facility], [place]  This is a 65 year old female who initially went to see Dr. Marin Olp because of a very strong history of cancer in her family. She had not had a mammogram in 2 years. She has never had a colonoscopy. She was evaluated by oncology who sent her for mammogram. There were some questionable findings are resulted in an MRI which subsequently resulted in a biopsy by MR guidance. She had an area of linear enhancement in the lower outer quadrant. There was another right breast mass at 9:00 that was not visualized on the MRI at the time of biopsy. 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 A biopsy clip was placed. She is now referred for surgical evaluation.  The patient has had significant weight loss with no explanation for the last several months. She has dropped almost 30 pounds and it appears that she was fairly thin even before that time. She describes no attempts at weight loss and has not noticed any change in her appetite. She also describes vague abdominal pain that seems to be localized mostly in the right upper quadrant. This does not seem to be correlated with any type of food or even a postprandial occurrence. She denies any nausea vomiting or diarrhea. Sometimes the pain occurs when she is exercising. Sometimes the pain radiates through to her back.  She has been referred to genetic counseling but had to reschedule her appointment because of a family situation.  Menarche - age 24 First pregnancy - 25 Breastfeed - 3 months Hormones - none Menopause - late 3's   Family history Multiple relatives with breast, ovarian, and pancreatic cancers  We evaluated her abdominal symptoms with a CT scan  which showed only some mildly wall thickening in the gastric cardia consistent with gastritis. She was referred to GI for endoscopy. EGD biopsies were positive for H. Pylori. She has not started her H. Pylori treatment yet because of issues with obtaining the medication from the pharmacy. She has begun gaining back some wait and her abdominal symptoms seem to be significantly improved.  CLINICAL DATA: 66 y/o F; 66 y/o F; right upper quadrant pain and fever.  EXAM: CT ABDOMEN AND PELVIS WITH CONTRAST  TECHNIQUE: Multidetector CT imaging of the abdomen and pelvis was performed using the standard protocol following bolus administration of intravenous contrast.  CONTRAST: 184m ISOVUE-300 IOPAMIDOL (ISOVUE-300) INJECTION 61%  COMPARISON: None.  FINDINGS: Lower chest: No acute abnormality.  Hepatobiliary: Subcentimeter well-circumscribed lucencies in the right lobe of liver likely represent small cysts. No other focal liver abnormality. Normal gallbladder. No intra or extrahepatic biliary ductal dilatation.  Pancreas: Unremarkable. No pancreatic ductal dilatation or surrounding inflammatory changes.  Spleen: Normal in size without focal abnormality.  Adrenals/Urinary Tract: Right kidney interpolar lesion measuring 22 x 33 x 28 mm with multiple internal septations (series 2, image 28 and series 3, image 47).  Normal adrenal glands. Subcentimeter left kidney interpolar cyst. No hydronephrosis. Normal bladder.  Stomach/Bowel: Wall thickening within the gastric cardia and proximal body may represent gastritis. No obstructive or inflammatory changes of small and large bowel. Normal appendix.  Vascular/Lymphatic: Aortic atherosclerosis with moderate mixed plaque. No enlarged abdominal or pelvic lymph nodes.  Reproductive: 29 mm uterine myoma.  Other: No abdominal  wall hernia or abnormality. No abdominopelvic ascites.  Musculoskeletal: No acute or significant osseous  findings.  IMPRESSION: 1. Wall thickening within gastric cardia and proximal body may represent gastritis. 2. 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 Renal protocol CT or MRI is recommended at 6 and 12 months, then yearly for 5 years. This recommendation follows ACR consensus guidelines: Management of the Incidental Renal Mass on CT: A White Paper of the ACR Incidental Findings Committee. J Am Coll Radiol [phone]. 3. Aortic atherosclerosis. 4. Myomatous uterus.   Electronically Signed By: Kristine Garbe M.D. On: 02/22/2017 16:32   Diagnosis 1. Surgical [P], ileocecal valve erosions - BENIGN COLORECTAL-TYPE MUCOSA WITH LYMPHOID AGGREGATES. - THERE IS NO EVIDENCE OF COLITIS, DYSPLASIA OR MALIGNANCY. 2. Surgical [P], random sites - BENIGN COLONIC MUCOSA. - NO SIGNIFICANT INFLAMMATION OR OTHER ABNORMALITIES IDENTIFIED. 3. Surgical [P], duodenum bulb - PEPTIC DUODENITIS. - THERE IS NO EVIDENCE OF DYSPLASIA OR MALIGNANCY. 4. Surgical [P], distal stomach - CHRONIC GASTRITIS WITH HELICOBACTER PYLORI ORGANISMS. - THERE IS NO EVIDENCE OF DYSPLASIA OR MALIGNANCY. - SEE COMMENT. 5. Surgical [P], proximal stomach - CHRONIC GASTRITIS WITH HELICOBACTER PYLORI ORGANISMS. - THERE IS NO EVIDENCE OF DYSPLASIA OR MALIGNANCY. - SEE COMMENT. Microscopic Comment 4. Warthin Starry stains performed on parts 4 and 5 highlight the presence of Helicobacter pylori organisms. Enid Cutter MD Pathologist, Electronic Signature (Case signed 04/11/2017)     CLINICAL DATA: Screening.  EXAM: 2D DIGITAL SCREENING BILATERAL MAMMOGRAM WITH CAD AND ADJUNCT TOMO  COMPARISON: Previous exam(s).  ACR Breast Density Category b: There are scattered areas of fibroglandular density.  FINDINGS: In the right breast, a possible mass warrants further evaluation. In the left breast, no findings suspicious for  malignancy. Images were processed with CAD.  IMPRESSION: Further evaluation is suggested for possible mass in the right breast.  RECOMMENDATION: Diagnostic mammogram and possibly ultrasound of the right breast. (Code:[id number])  The patient will be contacted regarding the findings, and additional imaging will be scheduled.  BI-RADS CATEGORY 0: Incomplete. Need additional imaging evaluation and/or prior mammograms for comparison.   Electronically Signed By: Nolon Nations M.D. On: 10/05/2016 07:53  CLINICAL DATA: 66 year old female for evaluation of possible right breast mass on screening mammogram. Patient complains today of diffuse bilateral breast pain.  EXAM: 2D DIGITAL DIAGNOSTIC RIGHT MAMMOGRAM WITH ADJUNCT TOMO  ULTRASOUND RIGHT BREAST  COMPARISON: Previous exam(s).  ACR Breast Density Category b: There are scattered areas of fibroglandular density.  FINDINGS: 2D and 3D spot compression views of the right breast demonstrate a very faint circumscribed oval mass within the upper-outer right breast.  Targeted ultrasound is performed, showing a 4 x 3 x 6 mm circumscribed oval hypoechoic mass at the 10 o'clock position of the right breast 4 cm from the nipple likely representing the mammographic finding. This most likely represents fibrocystic changes or possibly a benign lymph node.  IMPRESSION: 6 mm likely benign mass in the upper-outer right breast. Six-month followup is recommended to ensure stability.  RECOMMENDATION: Right diagnostic mammogram with possible right breast ultrasound in 6 months.  I have discussed the findings, causes of and remedies for breast pain and recommendations with the patient. Results were also provided in writing at the conclusion of the visit. If applicable, a reminder letter will be sent to the patient regarding the next appointment.  BI-RADS CATEGORY 3: Probably benign.   Electronically Signed By: Margarette Canada  M.D. On: 10/12/2016 14:59  ADDENDUM REPORT: 02/13/2017  09:09 ADDENDUM: This is an addendum to an MRI he performed on 01/11/2017. In the report it stated that linear enhancement was seen in the upper-outer quadrant of the right breast but should have stated the linear enhancement was in the lower outer quadrant of the right breast. Electronically Signed By: Lillia Mountain M.D. On: 02/13/2017 09:09 Addended by Luvenia Redden, MD on 02/13/2017 9:12 AM  Study Result CLINICAL DATA: Family history of breast cancer in 2 grandmothers, 2 aunts and 2 sisters. Diffuse bilateral breast pain with discharge. Patient feels lumps under both arms. Probable benign lesion was seen in the right breast on a prior diagnostic study dated 10/12/2016. LABS: Creatinine was obtained on site at [place] at [address] Results: Creatinine 0.9 mg/dL. EXAM: BILATERAL BREAST MRI WITH AND WITHOUT CONTRAST TECHNIQUE: Multiplanar, multisequence MR images of both breasts were obtained prior to and following the intravenous administration of 10 ml of MultiHance. THREE-DIMENSIONAL MR IMAGE RENDERING ON INDEPENDENT WORKSTATION: Three-dimensional MR images were rendered by post-processing of the original MR data on an independent workstation. The three-dimensional MR images were interpreted, and findings are reported in the following complete MRI report for this study. Three dimensional images were evaluated at the independent DynaCad workstation COMPARISON: Previous exam(s). FINDINGS: Breast composition: b. Scattered fibroglandular tissue. Background parenchymal enhancement: Moderate. Right breast: In the lateral aspect of the anterior third of the right breast is an indeterminate 4 mm enhancing nodule. In upper outer quadrant of the far posterior aspect of the right breast there is linear enhancement spanning 1.6 cm. Ductal carcinoma in-situ could not be excluded. Adjacent and superolateral to the  linear enhancement in the upper-outer quadrant is an indeterminate 5 mm enhancing nodule. Left breast: No mass or abnormal enhancement. Lymph nodes: There is no enlarged axillary adenopathy. Ancillary findings: None. IMPRESSION: Linear enhancement in the far posterior aspect of the upper-outer quadrant of the right breast. Ductal carcinoma in-situ could not be excluded. Two enhancing nodules in the lateral (9 o'clock) area of the right breast and upper outer quadrant. RECOMMENDATION: MR guided core biopsies of the linear enhancement in the right breast as well as the nodule in the 9 o'clock region of the right breast is recommended. If the MR guided core biopsies are benign I would recommend a short-term interval follow-up MRI in 6 months. BI-RADS CATEGORY 4: Suspicious. Electronically Signed: By: Lillia Mountain M.D. On: 01/11/2017 13:00   CLINICAL DATA: 66 year old female for MRI guided biopsies of an indeterminate area of linear enhancement in the lower, outer right breast and of a right breast mass at 9 o'clock.  EXAM: MRI GUIDED CORE NEEDLE BIOPSY OF THE RIGHT BREAST  TECHNIQUE: Multiplanar, multisequence MR imaging of the right breast was performed both before and after administration of intravenous contrast.  CONTRAST: 65m MULTIHANCE GADOBENATE DIMEGLUMINE 529 MG/ML IV SOLN  COMPARISON: Previous exams.  FINDINGS: I met with the patient, and we discussed the procedure of MRI guided biopsy, including risks, benefits, and alternatives. Specifically, we discussed the risks of infection, bleeding, tissue injury, clip migration, and inadequate sampling. Informed, written consent was given. The usual time out protocol was performed immediately prior to the procedure.  Using sterile technique, 1% Lidocaine, MRI guidance, and a 9 gauge vacuum assisted device, biopsy was performed of the area of linear, non mass enhancement in the lower, outer right breast using a lateral  to medial approach. At the conclusion of the procedure, a dumbbell-shaped tissue marker clip was deployed into the biopsy cavity. Follow-up  2-view mammogram was performed and dictated separately.  The right breast mass at 9 o'clock described in the patient's MRI report from 01/11/2017 was not visualized on today's exam to allow for biopsy.  IMPRESSION: 1. MRI guided biopsy of linear, non mass enhancement in the lower, outer right breast. 2. The right breast mass at 9 o'clock described in the patient's MRI report from 01/11/2017 was not visualized on today's exam to allow for biopsy. A six-month follow-up bilateral breast MRI with contrast is recommended for follow-up of the mass at 9 o'clock and of an additional mass described in the lower, outer right breast on recent MRI.  Electronically Signed: By: Pamelia Hoit M.D. On: 01/26/2017 10:02  ADDENDUM REPORT: 01/27/2017 14:31  ADDENDUM: Pathology revealed ATYPICAL LOBULAR HYPERPLASIA WITH CALCIFICATIONS, SCLEROSING PAPILLARY LESION of the Right breast, lower outer quadrant. This was found to be concordant by Dr. Pamelia Hoit, with excision recommended. Pathology results were discussed with the patient by telephone. The patient reported doing well after the biopsy with tenderness at the site. Post biopsy instructions and care were reviewed and questions were answered. The patient was encouraged to call [facility] for any additional concerns. Surgical consultation has been arranged with Dr. Donnie Mesa at [facility], at the request of Dr. Burney Gauze, on February 13, 2017.  Pathology results reported by Terie Purser, RN on 01/27/2017.   Electronically Signed By: Pamelia Hoit M.D. On: 01/27/2017 14:31  CLINICAL DATA: Status post MRI guided right breast biopsy  EXAM: DIAGNOSTIC RIGHT MAMMOGRAM POST MRI BIOPSY  COMPARISON: Previous exam(s).  FINDINGS: Mammographic images were obtained  following MRI guided biopsy of an indeterminate area of linear, non mass enhancement in the lower, outer right breast. Post biopsy mammogram demonstrates the dumbbell-shaped biopsy marker to be in the expected location within the lower, outer right breast, posterior depth.  IMPRESSION: Appropriate marker position as above.  Final Assessment: Post Procedure Mammograms for Marker Placement   Electronically Signed By: Pamelia Hoit M.D. On: 01/26/2017 10:03   Problem List/Past Medical Rodman Key K. 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 Jenavive Lamboy, MD; 04/17/2017 3:30 PM) Breast Biopsy Bilateral. multiple Breast Mass; Local Excision Left. Oral Surgery  Diagnostic Studies History Deanna Reynolds. Novalyn Lajara, MD; 04/17/2017 3:30 PM) Colonoscopy never Mammogram within last year Pap Smear 1-5 years ago  Allergies Rodman Key K. Kalimah Capurro, MD; 04/17/2017 3:30 PM) Ibuprofen *ANALGESICS - ANTI-INFLAMMATORY* Nausea, Vomiting.  Social History Deanna Reynolds. Gearl Baratta, MD; 04/17/2017 3:30 PM) Alcohol use Occasional alcohol use. Caffeine use Coffee, Tea. No drug use Tobacco use Former smoker.  Family History Deanna Reynolds. Sargon Scouten, MD; 04/17/2017 3:30 PM) Alcohol Abuse Brother, Family Members In General, Father, Mother. Arthritis Brother, Mother, Sister. Breast Cancer Family Members In General, Sister. Cerebrovascular Accident Mother. Cervical Cancer Family Members In General, Sister. Depression Daughter. Diabetes Mellitus Family Members In General, Mother. Heart Disease Family Members In General, Mother. Heart disease in female family member before age 78 Hypertension Mother. Migraine Headache Father, Sister. Ovarian Cancer Family Members In General. Respiratory Condition Mother. Seizure disorder Brother. Thyroid problems  Sister.  Pregnancy / Birth History Deanna Reynolds. Tania Steinhauser, MD; 04/17/2017 3:30 PM) Age at menarche 20 years. Age of menopause 35-55 Gravida 1 Irregular periods Length (months) of breastfeeding 3-6 Maternal age 3-25 Para 39  Other Problems Deanna Reynolds. Leza Apsey, MD; 04/17/2017 3:30 PM) Arthritis Back Pain Depression Heart murmur Hemorrhoids  Lump In Breast Migraine Headache Oophorectomy Left.    Vitals (Janette Ranson CMA; 04/17/2017 10:57 AM) 04/17/2017 10:57 AM Weight: 119 lb Height: 61in Body Surface Area: 1.52 m Body Mass Index: 22.48 kg/m  Pulse: 77 (Regular)  BP: 120/72 (Sitting, Left Arm, Standard)      Physical Exam Rodman Key K. Vali Capano MD; 04/17/2017 3:30 PM)  The physical exam findings are as follows: Note:WDWN in NAD Eyes: Pupils equal, round; sclera anicteric HENT: Oral mucosa moist; good dentition Neck: No masses palpated, no thyromegaly Breasts: symmetric; bruising has resolved; no other dominant masses; no axillary lymphadenopathy; no nipple retraction or discharge Lungs: CTA bilaterally; normal respiratory effort CV: Regular rate and rhythm; no murmurs; extremities well-perfused with no edema Abd: +bowel sounds, soft, mild diffuse tenderness; minimally tender in RUQ, no palpable organomegaly; no palpable hernias Skin: Warm, dry; no sign of jaundice Psychiatric - alert and oriented x 4; calm mood and affect    Assessment & Plan Rodman Key K. 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 The surgical procedure has been discussed with the patient. Potential risks, benefits, alternative treatments, and expected outcomes have been explained. All of the patient's questions at this time have been answered. The likelihood of reaching the patient's treatment goal is good. The patient understand the proposed surgical  procedure and wishes to proceed.  Deanna Reynolds. Georgette Dover, MD, [facility]  General/ Trauma Surgery  04/17/2017 3:30 PM

## 2017-04-26 NOTE — Anesthesia Procedure Notes (Signed)
Procedure Name: LMA Insertion Performed by: Verita Lamb Pre-anesthesia Checklist: Patient identified, Emergency Drugs available, Suction available, Patient being monitored and Timeout performed Patient Re-evaluated:Patient Re-evaluated prior to induction Preoxygenation: Pre-oxygenation with 100% oxygen Induction Type: IV induction LMA: LMA inserted LMA Size: 3.0 Tube type: Oral Number of attempts: 1

## 2017-04-27 ENCOUNTER — Telehealth: Payer: Self-pay | Admitting: Gastroenterology

## 2017-04-27 ENCOUNTER — Encounter (HOSPITAL_BASED_OUTPATIENT_CLINIC_OR_DEPARTMENT_OTHER): Payer: Self-pay | Admitting: Surgery

## 2017-04-27 NOTE — Telephone Encounter (Signed)
The pt states she thinks she has a vaginal yeast infection. She has been on Pylera and thinks that is the cause.  She has not seen any MD for her symptoms that include itching, and vaginal discharge.  I advised her to call her GYN or PCP for evaluation and treatment. She did agree and states she will follow up with them

## 2017-05-02 ENCOUNTER — Ambulatory Visit: Payer: Self-pay | Admitting: Surgery

## 2017-05-02 DIAGNOSIS — C50911 Malignant neoplasm of unspecified site of right female breast: Secondary | ICD-10-CM

## 2017-05-08 ENCOUNTER — Encounter: Payer: Self-pay | Admitting: Radiation Oncology

## 2017-05-08 ENCOUNTER — Encounter (HOSPITAL_BASED_OUTPATIENT_CLINIC_OR_DEPARTMENT_OTHER): Payer: Self-pay | Admitting: *Deleted

## 2017-05-08 ENCOUNTER — Ambulatory Visit: Payer: Self-pay | Admitting: Surgery

## 2017-05-11 ENCOUNTER — Telehealth: Payer: Self-pay | Admitting: Hematology & Oncology

## 2017-05-11 NOTE — Telephone Encounter (Signed)
lvm to inform pt of 11/5 appt at 145 per sch msg

## 2017-05-15 MED ORDER — BUPIVACAINE-EPINEPHRINE (PF) 0.25% -1:200000 IJ SOLN
INTRAMUSCULAR | Status: AC
  Filled 2017-05-15: qty 90

## 2017-05-15 MED ORDER — SODIUM CHLORIDE 0.9 % IJ SOLN
INTRAMUSCULAR | Status: AC
  Filled 2017-05-15: qty 20

## 2017-05-15 MED ORDER — METHYLENE BLUE 0.5 % INJ SOLN
INTRAVENOUS | Status: AC
  Filled 2017-05-15: qty 20

## 2017-05-15 MED ORDER — HEPARIN (PORCINE) IN NACL 2-0.9 UNIT/ML-% IJ SOLN
INTRAMUSCULAR | Status: AC
  Filled 2017-05-15: qty 1000

## 2017-05-15 MED ORDER — HEPARIN SOD (PORK) LOCK FLUSH 100 UNIT/ML IV SOLN
INTRAVENOUS | Status: AC
  Filled 2017-05-15: qty 10

## 2017-05-15 NOTE — Progress Notes (Signed)
NPO past midnight tonight except ensure presurg to complete at 0400. Pt verbalized understanding.

## 2017-05-16 ENCOUNTER — Encounter (HOSPITAL_BASED_OUTPATIENT_CLINIC_OR_DEPARTMENT_OTHER)
Admission: RE | Disposition: A | Discharge status: Home / Self Care | Payer: Self-pay | Source: Ambulatory Visit | Attending: Surgery

## 2017-05-16 ENCOUNTER — Encounter (HOSPITAL_BASED_OUTPATIENT_CLINIC_OR_DEPARTMENT_OTHER): Payer: Self-pay | Admitting: Emergency Medicine

## 2017-05-16 ENCOUNTER — Ambulatory Visit (HOSPITAL_BASED_OUTPATIENT_CLINIC_OR_DEPARTMENT_OTHER): Payer: Medicare HMO | Admitting: Anesthesiology

## 2017-05-16 ENCOUNTER — Ambulatory Visit (HOSPITAL_BASED_OUTPATIENT_CLINIC_OR_DEPARTMENT_OTHER)
Admission: RE | Admit: 2017-05-16 | Discharge: 2017-05-16 | Disposition: A | Discharge status: Home / Self Care | Payer: Medicare HMO | Source: Ambulatory Visit | Attending: Surgery | Admitting: Surgery

## 2017-05-16 ENCOUNTER — Ambulatory Visit (HOSPITAL_COMMUNITY)
Admission: RE | Admit: 2017-05-16 | Discharge: 2017-05-16 | Disposition: A | Discharge status: Home / Self Care | Payer: Medicare HMO | Source: Ambulatory Visit | Attending: Surgery | Admitting: Surgery

## 2017-05-16 ENCOUNTER — Ambulatory Visit (HOSPITAL_COMMUNITY): Payer: Medicare HMO

## 2017-05-16 DIAGNOSIS — Z803 Family history of malignant neoplasm of breast: Secondary | ICD-10-CM | POA: Insufficient documentation

## 2017-05-16 DIAGNOSIS — D259 Leiomyoma of uterus, unspecified: Secondary | ICD-10-CM | POA: Diagnosis not present

## 2017-05-16 DIAGNOSIS — C50919 Malignant neoplasm of unspecified site of unspecified female breast: Secondary | ICD-10-CM

## 2017-05-16 DIAGNOSIS — F329 Major depressive disorder, single episode, unspecified: Secondary | ICD-10-CM | POA: Insufficient documentation

## 2017-05-16 DIAGNOSIS — Z1501 Genetic susceptibility to malignant neoplasm of breast: Secondary | ICD-10-CM | POA: Diagnosis not present

## 2017-05-16 DIAGNOSIS — C50411 Malignant neoplasm of upper-outer quadrant of right female breast: Secondary | ICD-10-CM | POA: Diagnosis present

## 2017-05-16 DIAGNOSIS — Z8261 Family history of arthritis: Secondary | ICD-10-CM | POA: Insufficient documentation

## 2017-05-16 DIAGNOSIS — Z800 Family history of malignant neoplasm of digestive organs: Secondary | ICD-10-CM | POA: Diagnosis not present

## 2017-05-16 DIAGNOSIS — Z87891 Personal history of nicotine dependence: Secondary | ICD-10-CM | POA: Insufficient documentation

## 2017-05-16 DIAGNOSIS — C50911 Malignant neoplasm of unspecified site of right female breast: Secondary | ICD-10-CM

## 2017-05-16 DIAGNOSIS — Z820 Family history of epilepsy and other diseases of the nervous system: Secondary | ICD-10-CM | POA: Diagnosis not present

## 2017-05-16 DIAGNOSIS — Z8249 Family history of ischemic heart disease and other diseases of the circulatory system: Secondary | ICD-10-CM | POA: Insufficient documentation

## 2017-05-16 DIAGNOSIS — Z833 Family history of diabetes mellitus: Secondary | ICD-10-CM | POA: Diagnosis not present

## 2017-05-16 DIAGNOSIS — Z8041 Family history of malignant neoplasm of ovary: Secondary | ICD-10-CM | POA: Insufficient documentation

## 2017-05-16 DIAGNOSIS — Z811 Family history of alcohol abuse and dependence: Secondary | ICD-10-CM | POA: Insufficient documentation

## 2017-05-16 DIAGNOSIS — Z836 Family history of other diseases of the respiratory system: Secondary | ICD-10-CM | POA: Diagnosis not present

## 2017-05-16 DIAGNOSIS — Z8049 Family history of malignant neoplasm of other genital organs: Secondary | ICD-10-CM | POA: Diagnosis not present

## 2017-05-16 DIAGNOSIS — Z823 Family history of stroke: Secondary | ICD-10-CM | POA: Insufficient documentation

## 2017-05-16 DIAGNOSIS — I700 Atherosclerosis of aorta: Secondary | ICD-10-CM | POA: Insufficient documentation

## 2017-05-16 DIAGNOSIS — G43909 Migraine, unspecified, not intractable, without status migrainosus: Secondary | ICD-10-CM | POA: Insufficient documentation

## 2017-05-16 DIAGNOSIS — M1990 Unspecified osteoarthritis, unspecified site: Secondary | ICD-10-CM | POA: Insufficient documentation

## 2017-05-16 DIAGNOSIS — N644 Mastodynia: Secondary | ICD-10-CM | POA: Insufficient documentation

## 2017-05-16 DIAGNOSIS — Z452 Encounter for adjustment and management of vascular access device: Secondary | ICD-10-CM

## 2017-05-16 DIAGNOSIS — R011 Cardiac murmur, unspecified: Secondary | ICD-10-CM | POA: Insufficient documentation

## 2017-05-16 HISTORY — PX: PORTACATH PLACEMENT: SHX2246

## 2017-05-16 HISTORY — PX: AXILLARY SENTINEL NODE BIOPSY: SHX5738

## 2017-05-16 SURGERY — BIOPSY, LYMPH NODE, SENTINEL, AXILLARY
Anesthesia: General | Site: Chest | Laterality: Right

## 2017-05-16 MED ORDER — LACTATED RINGERS IV SOLN
INTRAVENOUS | Status: DC
  Administered 2017-05-16 (×2): via INTRAVENOUS

## 2017-05-16 MED ORDER — OXYCODONE HCL 5 MG/5ML PO SOLN: 5.0000 mg | Freq: Once | ORAL | Status: AC | PRN

## 2017-05-16 MED ORDER — HYDROCODONE-ACETAMINOPHEN 5-325 MG PO TABS: 1.0000 | ORAL_TABLET | ORAL | 0 refills | Status: DC | PRN

## 2017-05-16 MED ORDER — ZOLPIDEM TARTRATE ER 6.25 MG PO TBCR: 6.2500 mg | EXTENDED_RELEASE_TABLET | Freq: Every evening | ORAL | 0 refills | Status: DC | PRN

## 2017-05-16 MED ORDER — CHLORHEXIDINE GLUCONATE CLOTH 2 % EX PADS: 6.0000 | MEDICATED_PAD | Freq: Once | CUTANEOUS | Status: DC

## 2017-05-16 MED ORDER — BUPIVACAINE-EPINEPHRINE (PF) 0.25% -1:200000 IJ SOLN
INTRAMUSCULAR | Status: AC
  Filled 2017-05-16: qty 90

## 2017-05-16 MED ORDER — MIDAZOLAM HCL 2 MG/2ML IJ SOLN
INTRAMUSCULAR | Status: AC
  Filled 2017-05-16: qty 2

## 2017-05-16 MED ORDER — DEXAMETHASONE SODIUM PHOSPHATE 10 MG/ML IJ SOLN
INTRAMUSCULAR | Status: AC
  Filled 2017-05-16: qty 1

## 2017-05-16 MED ORDER — PHENYLEPHRINE HCL 10 MG/ML IJ SOLN
INTRAMUSCULAR | Status: DC | PRN
  Administered 2017-05-16: 80 ug via INTRAVENOUS

## 2017-05-16 MED ORDER — HEPARIN SOD (PORK) LOCK FLUSH 100 UNIT/ML IV SOLN
INTRAVENOUS | Status: AC
  Filled 2017-05-16: qty 10

## 2017-05-16 MED ORDER — SODIUM CHLORIDE 0.9 % IJ SOLN
INTRAMUSCULAR | Status: AC
  Filled 2017-05-16: qty 10

## 2017-05-16 MED ORDER — PROPOFOL 10 MG/ML IV BOLUS
INTRAVENOUS | Status: DC | PRN
  Administered 2017-05-16: 100 mg via INTRAVENOUS

## 2017-05-16 MED ORDER — FENTANYL CITRATE (PF) 100 MCG/2ML IJ SOLN
INTRAMUSCULAR | Status: AC
  Filled 2017-05-16: qty 2

## 2017-05-16 MED ORDER — ACETAMINOPHEN 500 MG PO TABS
1000.0000 mg | ORAL_TABLET | ORAL | Status: AC
  Administered 2017-05-16: 1000 mg via ORAL

## 2017-05-16 MED ORDER — FENTANYL CITRATE (PF) 100 MCG/2ML IJ SOLN
50.0000 ug | INTRAMUSCULAR | Status: DC | PRN
  Administered 2017-05-16 (×2): 50 ug via INTRAVENOUS

## 2017-05-16 MED ORDER — BUPIVACAINE-EPINEPHRINE (PF) 0.5% -1:200000 IJ SOLN
INTRAMUSCULAR | Status: DC | PRN
  Administered 2017-05-16: 30 mL via PERINEURAL

## 2017-05-16 MED ORDER — PROPOFOL 10 MG/ML IV BOLUS
INTRAVENOUS | Status: AC
  Filled 2017-05-16: qty 20

## 2017-05-16 MED ORDER — EPHEDRINE 5 MG/ML INJ
INTRAVENOUS | Status: AC
  Filled 2017-05-16: qty 10

## 2017-05-16 MED ORDER — METHYLENE BLUE 0.5 % INJ SOLN
INTRAVENOUS | Status: AC
  Filled 2017-05-16: qty 10

## 2017-05-16 MED ORDER — FENTANYL CITRATE (PF) 100 MCG/2ML IJ SOLN
25.0000 ug | INTRAMUSCULAR | Status: DC | PRN
  Administered 2017-05-16 (×2): 25 ug via INTRAVENOUS
  Administered 2017-05-16: 50 ug via INTRAVENOUS

## 2017-05-16 MED ORDER — METHYLENE BLUE 0.5 % INJ SOLN
INTRAVENOUS | Status: DC | PRN
  Administered 2017-05-16: 5 mL via INTRAMUSCULAR

## 2017-05-16 MED ORDER — OXYCODONE HCL 5 MG PO TABS
5.0000 mg | ORAL_TABLET | Freq: Once | ORAL | Status: AC | PRN
  Administered 2017-05-16: 5 mg via ORAL

## 2017-05-16 MED ORDER — MIDAZOLAM HCL 2 MG/2ML IJ SOLN
1.0000 mg | INTRAMUSCULAR | Status: DC | PRN
  Administered 2017-05-16 (×2): 1 mg via INTRAVENOUS

## 2017-05-16 MED ORDER — ACETAMINOPHEN 500 MG PO TABS
ORAL_TABLET | ORAL | Status: AC
  Filled 2017-05-16: qty 2

## 2017-05-16 MED ORDER — HEPARIN SOD (PORK) LOCK FLUSH 100 UNIT/ML IV SOLN
INTRAVENOUS | Status: DC | PRN
Start: 2017-05-16 — End: 2017-05-16
  Administered 2017-05-16: 500 [IU] via INTRAVENOUS

## 2017-05-16 MED ORDER — DEXAMETHASONE SODIUM PHOSPHATE 4 MG/ML IJ SOLN
INTRAMUSCULAR | Status: DC | PRN
  Administered 2017-05-16: 10 mg via INTRAVENOUS

## 2017-05-16 MED ORDER — TECHNETIUM TC 99M SULFUR COLLOID FILTERED
1.0000 | Freq: Once | INTRAVENOUS | Status: AC | PRN
  Administered 2017-05-16: 1 via INTRADERMAL

## 2017-05-16 MED ORDER — LIDOCAINE HCL (CARDIAC) 20 MG/ML IV SOLN
INTRAVENOUS | Status: DC | PRN
  Administered 2017-05-16: 30 mg via INTRAVENOUS

## 2017-05-16 MED ORDER — FENTANYL CITRATE (PF) 100 MCG/2ML IJ SOLN
INTRAMUSCULAR | Status: DC | PRN
  Administered 2017-05-16 (×2): 25 ug via INTRAVENOUS

## 2017-05-16 MED ORDER — OXYCODONE HCL 5 MG PO TABS
ORAL_TABLET | ORAL | Status: AC
  Filled 2017-05-16: qty 1

## 2017-05-16 MED ORDER — GABAPENTIN 300 MG PO CAPS
300.0000 mg | ORAL_CAPSULE | ORAL | Status: AC
  Administered 2017-05-16: 300 mg via ORAL

## 2017-05-16 MED ORDER — EPHEDRINE SULFATE 50 MG/ML IJ SOLN
INTRAMUSCULAR | Status: DC | PRN
  Administered 2017-05-16: 10 mg via INTRAVENOUS

## 2017-05-16 MED ORDER — LIDOCAINE 2% (20 MG/ML) 5 ML SYRINGE
INTRAMUSCULAR | Status: AC
  Filled 2017-05-16: qty 5

## 2017-05-16 MED ORDER — GABAPENTIN 300 MG PO CAPS
ORAL_CAPSULE | ORAL | Status: AC
  Filled 2017-05-16: qty 1

## 2017-05-16 MED ORDER — HEPARIN (PORCINE) IN NACL 2-0.9 UNIT/ML-% IJ SOLN
INTRAMUSCULAR | Status: AC | PRN
  Administered 2017-05-16: 500 mL

## 2017-05-16 MED ORDER — HEPARIN (PORCINE) IN NACL 2-0.9 UNIT/ML-% IJ SOLN
INTRAMUSCULAR | Status: AC
  Filled 2017-05-16: qty 1000

## 2017-05-16 MED ORDER — PHENYLEPHRINE 40 MCG/ML (10ML) SYRINGE FOR IV PUSH (FOR BLOOD PRESSURE SUPPORT)
PREFILLED_SYRINGE | INTRAVENOUS | Status: AC
  Filled 2017-05-16: qty 10

## 2017-05-16 MED ORDER — BUPIVACAINE-EPINEPHRINE 0.25% -1:200000 IJ SOLN
INTRAMUSCULAR | Status: DC | PRN
  Administered 2017-05-16: 20 mL

## 2017-05-16 MED ORDER — ONDANSETRON HCL 4 MG/2ML IJ SOLN
INTRAMUSCULAR | Status: AC
  Filled 2017-05-16: qty 2

## 2017-05-16 MED ORDER — SCOPOLAMINE 1 MG/3DAYS TD PT72: 1.0000 | MEDICATED_PATCH | Freq: Once | TRANSDERMAL | Status: DC | PRN

## 2017-05-16 MED ORDER — CEFAZOLIN SODIUM-DEXTROSE 2-4 GM/100ML-% IV SOLN
INTRAVENOUS | Status: AC
  Filled 2017-05-16: qty 100

## 2017-05-16 MED ORDER — CEFAZOLIN SODIUM-DEXTROSE 2-4 GM/100ML-% IV SOLN
2.0000 g | INTRAVENOUS | Status: AC
  Administered 2017-05-16: 2 g via INTRAVENOUS

## 2017-05-16 SURGICAL SUPPLY — 55 items
APPLIER CLIP 9.375 MED OPEN (MISCELLANEOUS) ×4
BAG DECANTER FOR FLEXI CONT (MISCELLANEOUS) ×4 IMPLANT
BENZOIN TINCTURE PRP APPL 2/3 (GAUZE/BANDAGES/DRESSINGS) ×8 IMPLANT
BLADE CLIPPER SURG (BLADE) ×4 IMPLANT
BLADE HEX COATED 2.75 (ELECTRODE) ×4 IMPLANT
BLADE SURG 11 STRL SS (BLADE) ×4 IMPLANT
BLADE SURG 15 STRL LF DISP TIS (BLADE) ×2 IMPLANT
BLADE SURG 15 STRL SS (BLADE) ×2
CANISTER SUCT 1200ML W/VALVE (MISCELLANEOUS) ×4 IMPLANT
CHLORAPREP W/TINT 26ML (MISCELLANEOUS) ×4 IMPLANT
CLIP APPLIE 9.375 MED OPEN (MISCELLANEOUS) ×2 IMPLANT
CLOSURE WOUND 1/2 X4 (GAUZE/BANDAGES/DRESSINGS) ×3
COVER BACK TABLE 60X90IN (DRAPES) ×4 IMPLANT
COVER MAYO STAND STRL (DRAPES) ×4 IMPLANT
COVER PROBE W GEL 5X96 (DRAPES) ×4 IMPLANT
DECANTER SPIKE VIAL GLASS SM (MISCELLANEOUS) ×4 IMPLANT
DRAPE C-ARM 42X72 X-RAY (DRAPES) ×4 IMPLANT
DRAPE LAPAROSCOPIC ABDOMINAL (DRAPES) ×4 IMPLANT
DRAPE UTILITY XL STRL (DRAPES) ×4 IMPLANT
DRSG TEGADERM 4X4.75 (GAUZE/BANDAGES/DRESSINGS) ×8 IMPLANT
ELECT REM PT RETURN 9FT ADLT (ELECTROSURGICAL) ×4
ELECTRODE REM PT RTRN 9FT ADLT (ELECTROSURGICAL) ×2 IMPLANT
GAUZE SPONGE 4X4 12PLY STRL LF (GAUZE/BANDAGES/DRESSINGS) ×4 IMPLANT
GLOVE BIO SURGEON STRL SZ7 (GLOVE) ×4 IMPLANT
GLOVE BIO SURGEON STRL SZ7.5 (GLOVE) ×4 IMPLANT
GLOVE BIOGEL PI IND STRL 6.5 (GLOVE) ×2 IMPLANT
GLOVE BIOGEL PI IND STRL 7.5 (GLOVE) ×4 IMPLANT
GLOVE BIOGEL PI INDICATOR 6.5 (GLOVE) ×2
GLOVE BIOGEL PI INDICATOR 7.5 (GLOVE) ×4
GOWN STRL REUS W/ TWL LRG LVL3 (GOWN DISPOSABLE) ×4 IMPLANT
GOWN STRL REUS W/TWL LRG LVL3 (GOWN DISPOSABLE) ×4
KIT PORT POWER 8FR ISP CVUE (Miscellaneous) ×4 IMPLANT
NDL SAFETY ECLIPSE 18X1.5 (NEEDLE) ×2 IMPLANT
NEEDLE HYPO 18GX1.5 SHARP (NEEDLE) ×2
NEEDLE HYPO 25X1 1.5 SAFETY (NEEDLE) ×8 IMPLANT
PACK BASIN DAY SURGERY FS (CUSTOM PROCEDURE TRAY) ×4 IMPLANT
PENCIL BUTTON HOLSTER BLD 10FT (ELECTRODE) ×4 IMPLANT
SHEET MEDIUM DRAPE 40X70 STRL (DRAPES) ×8 IMPLANT
SLEEVE SCD COMPRESS KNEE MED (MISCELLANEOUS) ×4 IMPLANT
SPONGE GAUZE 2X2 8PLY STER LF (GAUZE/BANDAGES/DRESSINGS) ×2
SPONGE GAUZE 2X2 8PLY STRL LF (GAUZE/BANDAGES/DRESSINGS) ×6 IMPLANT
SPONGE LAP 4X18 X RAY DECT (DISPOSABLE) ×4 IMPLANT
STRIP CLOSURE SKIN 1/2X4 (GAUZE/BANDAGES/DRESSINGS) ×9 IMPLANT
SUT MON AB 4-0 PC3 18 (SUTURE) ×8 IMPLANT
SUT PROLENE 2 0 CT2 30 (SUTURE) ×4 IMPLANT
SUT VIC AB 3-0 SH 27 (SUTURE) ×4
SUT VIC AB 3-0 SH 27X BRD (SUTURE) ×4 IMPLANT
SYR 5ML LUER SLIP (SYRINGE) ×4 IMPLANT
SYR BULB 3OZ (MISCELLANEOUS) ×4 IMPLANT
SYR CONTROL 10ML LL (SYRINGE) ×8 IMPLANT
TOWEL OR 17X24 6PK STRL BLUE (TOWEL DISPOSABLE) ×4 IMPLANT
TOWEL OR NON WOVEN STRL DISP B (DISPOSABLE) ×4 IMPLANT
TUBE CONNECTING 20'X1/4 (TUBING) ×1
TUBE CONNECTING 20X1/4 (TUBING) ×3 IMPLANT
YANKAUER SUCT BULB TIP NO VENT (SUCTIONS) ×4 IMPLANT

## 2017-05-16 NOTE — Progress Notes (Signed)
Assisted Dr. Foster with right, ultrasound guided, pectoralis block. Side rails up, monitors on throughout procedure. See vital signs in flow sheet. Tolerated Procedure well. 

## 2017-05-16 NOTE — Op Note (Signed)
Preop diagnosis: Right invasive ductal carcinoma Postop diagnosis: Same Procedure performed: Ultrasound-guided right internal jugular vein port placement Right axillary sentinel lymph node biopsy Blue dye injection Surgeon:Betzaira Mentel K. Anesthesia: Gen. via LMA Indications: [name] who was recently found to have an abnormal area in the lateral lower right breast. This was biopsied and showed atypical lobular hyperplasia with calcifications.  She underwent radioactive seed localized lumpectomy on 04/17/17. 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 Margins were clear. HER-2 positive. She presents now for placement as well as axillary sentinel lymph node biopsy.  Description of procedure: The patient received a pack block in the preoperative area. She was also injected with radioactive tracer in the preoperative area.  She was brought to the operating room and placed in a supine position on the operating room table. After an adequate level of general anesthesia was obtained, a small roll was placed behind her shoulders. Her head was turned to the left. The patient has 2 large external jugular veins that are very prominent. We prepped the right neck and the right chest as well as the axilla and upper arm with ChloraPrep and draped sterile fashion. A timeout was taken to ensure the proper patient and proper procedure. We injected methylene blue dye solution around the nipple. We placed the patient in Trendelenburg to distend her jugular vein. We interrogated the neck with ultrasound. I can identify the internal jugular vein. Under ultrasound guidance we were able to cannulate the internal jugular vein with the 18-gauge needle. The wire was introduced and advanced into the right atrium. This was confirmed with fluoroscopy.  I created a subcutaneous pocket below the right clavicle. We used 0.25% Marcaine with epinephrine for anesthetic locally. The 8 French ClearView port was  assembled. We used the tunneling device to tunnel from the subjacent pocket up to the insertion site in the neck. We were able to avoid the external jugular veins.  The dilator breakaway sheath were then advanced under fluoroscopy over the wire. We has already cut the catheter to appropriate length. The dilator and wire were removed. We advanced the catheter through the breakaway sheath into the superior vena cava at the cavoatrial junction. The breakaway sheath was removed. We were able to aspirate blood and flushed easily. The port was then secured with 2-0 Prolene sutures. We instilled concentrated heparin solution. The incision was closed with 3-0 Vicryl and 4-0 Monocryl. The insertion site was closed with 4-0 Monocryl.  We then turned our attention to the right axilla. We interrogated the axilla with the neoprobe. A mid transverse incision across this area after anesthetizing with 0.25% Marcaine. Dissection was carried down through the subcutaneous tissues into the axilla.  We were able to identify a hot blue lymph node immediately. This was removed and sent as sentinel lymph node #1. Another hot lymph node was identified directly behind this area was removed and sent as sentinel lymph node #2. Background activity was minimal. We irrigated the wound thoroughly and inspected for hemostasis. The wound was closed with 3-0 Vicryl and 4-0 Monocryl. Benzoin Steri-Strips were applied to all the incisions. Occlusive dressings were applied. The patient was then extubated and brought to recovery room in stable condition. All sponge, instrument, and needle counts are correct.  Deanna Reynolds. Deanna Dover, MD, [facility]  General/ Trauma Surgery  05/16/2017 9:16 AM

## 2017-05-16 NOTE — Discharge Instructions (Signed)

## 2017-05-16 NOTE — Anesthesia Procedure Notes (Addendum)
Anesthesia Regional Block: Pectoralis block   Pre-Anesthetic Checklist: ,, timeout performed, Correct Patient, Correct Site, Correct Laterality, Correct Procedure, Correct Position, site marked, Risks and benefits discussed,  Surgical consent,  Pre-op evaluation,  At surgeon's request and post-op pain management  Laterality: Right  Prep: chloraprep       Needles:  Injection technique: Single-shot  Needle Type: Echogenic Stimulator Needle     Needle Length: 9cm  Needle Gauge: 21   Needle insertion depth: 3.5 cm   Additional Needles:   Procedures:,,,, ultrasound used (permanent image in chart),,,,  Narrative:  Start time: 05/16/2017 7:14 AM End time: 05/16/2017 7:19 AM Injection made incrementally with aspirations every 5 mL.  Performed by: Personally  Anesthesiologist: Josephine Igo  Additional Notes: Timeout performed. Patient sedated. Relevant anatomy ID'd using [place]. Incremental 2-18ml injection of LA with frequent aspiration. Patient tolerated procedure well.

## 2017-05-16 NOTE — Anesthesia Preprocedure Evaluation (Addendum)
Anesthesia Evaluation  Patient identified by MRN, date of birth, ID band Patient awake    Reviewed: Allergy & Precautions, H&P , NPO status , Patient's Chart, lab work & pertinent test results  Airway Mallampati: II   Neck ROM: full    Dental no notable dental hx. (+) Missing, Poor Dentition, Partial Upper, Dental Advisory Given   Pulmonary former smoker,    Pulmonary exam normal breath sounds clear to auscultation       Cardiovascular hypertension, Pt. on medications Normal cardiovascular exam Rhythm:Regular Rate:Normal     Neuro/Psych PSYCHIATRIC DISORDERS Depression  Neuromuscular disease    GI/Hepatic Neg liver ROS, GERD  ,  Endo/Other  Right breast Ca  Renal/GU negative Renal ROS  negative genitourinary   Musculoskeletal  (+) Arthritis , Osteoarthritis,  DDD    Abdominal   Peds  Hematology negative hematology ROS (+)   Anesthesia Other Findings   Reproductive/Obstetrics negative OB ROS                            Anesthesia Physical  Anesthesia Plan  ASA: II  Anesthesia Plan: General   Post-op Pain Management:  Regional for Post-op pain   Induction: Intravenous  PONV Risk Score and Plan: 3 and 4 or greater and Ondansetron, Dexamethasone, Midazolam, Treatment may vary due to age or medical condition and Propofol infusion  Airway Management Planned: LMA  Additional Equipment:   Intra-op Plan:   Post-operative Plan: Extubation in OR  Informed Consent: I have reviewed the patients History and Physical, chart, labs and discussed the procedure including the risks, benefits and alternatives for the proposed anesthesia with the patient or authorized representative who has indicated his/her understanding and acceptance.   Dental advisory given  Plan Discussed with: CRNA, Anesthesiologist and Surgeon  Anesthesia Plan Comments:         Anesthesia Quick Evaluation

## 2017-05-16 NOTE — Transfer of Care (Signed)
Immediate Anesthesia Transfer of Care Note  Patient: Quarry manager  Procedure(s) Performed: RIGHT AXILLARY SENTINEL LYMPH NODE BIOPSY ERAS PATHWAY (Right Axilla) INSERTION PORT-A-CATH WITH ULTRASOUND GUIDANCE (Right Chest)  Patient Location: PACU  Anesthesia Type:General and GA combined with regional for post-op pain  Level of Consciousness: awake and patient cooperative  Airway & Oxygen Therapy: Patient Spontanous Breathing and Patient connected to face mask oxygen  Post-op Assessment: Report given to RN and Post -op Vital signs reviewed and stable  Post vital signs: Reviewed and stable  Last Vitals:  Vitals:   05/16/17 0721 05/16/17 0726  BP: 121/68 121/73  Pulse: 69 74  Resp: 18 (!) 22  Temp:    SpO2: 100% 100%    Last Pain:  Vitals:   05/16/17 0726  TempSrc:   PainSc: 0-No pain         Complications: No apparent anesthesia complications

## 2017-05-16 NOTE — Anesthesia Postprocedure Evaluation (Signed)
Anesthesia Post Note  Patient: Quarry manager  Procedure(s) Performed: RIGHT AXILLARY SENTINEL LYMPH NODE BIOPSY ERAS PATHWAY (Right Axilla) INSERTION PORT-A-CATH WITH ULTRASOUND GUIDANCE (Right Chest)     Patient location during evaluation: PACU Anesthesia Type: General Level of consciousness: awake and alert and oriented Pain management: pain level controlled Vital Signs Assessment: post-procedure vital signs reviewed and stable Respiratory status: spontaneous breathing, nonlabored ventilation and respiratory function stable Cardiovascular status: blood pressure returned to baseline and stable Postop Assessment: no apparent nausea or vomiting Anesthetic complications: no    Last Vitals:  Vitals:   05/16/17 0930 05/16/17 0945  BP: (!) 160/78 (!) 164/78  Pulse: 80 78  Resp: 17 20  Temp:    SpO2: 100% 100%    Last Pain:  Vitals:   05/16/17 0957  TempSrc:   PainSc: 7                  Malichi Palardy A.

## 2017-05-16 NOTE — Anesthesia Procedure Notes (Signed)
Procedure Name: LMA Insertion Date/Time: 05/16/2017 7:45 AM Performed by: Toula Moos L Pre-anesthesia Checklist: Patient identified, Emergency Drugs available, Suction available, Patient being monitored and Timeout performed Patient Re-evaluated:Patient Re-evaluated prior to induction Oxygen Delivery Method: Circle system utilized Preoxygenation: Pre-oxygenation with 100% oxygen Induction Type: IV induction Ventilation: Mask ventilation without difficulty LMA: LMA inserted LMA Size: 4.0 Number of attempts: 1 Airway Equipment and Method: Bite block Placement Confirmation: positive ETCO2 Tube secured with: Tape Dental Injury: Teeth and Oropharynx as per pre-operative assessment

## 2017-05-16 NOTE — H&P (Signed)
History of Present Illness Deanna Reynolds. Sibley Rolison MD; 04/17/2017 3:29 PM) The patient is a 66 year old female who presents with a breast mass. Referred by Dr. Burney Gauze Dr. Pamelia Hoit for right breast [facility]  This is a 66 year old female who initially went to see Dr. Marin Olp because of a very strong history of cancer in her family. She had not had a mammogram in 2 years. She has never had a colonoscopy. She was evaluated by oncology who sent her for mammogram. There were some questionable findings are resulted in an MRI which subsequently resulted in a biopsy by MR guidance. She had an area of linear enhancement in the lower outer quadrant. There was another right breast mass at 9:00 that was not visualized on the MRI at the time of biopsy. 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 A biopsy clip was placed. She underwent right RSL on 9/10 and pathology showed invasive ductal carcinoma.  She presents now for node biopsy and port placement.  .  Menarche - age 80 First pregnancy - 25 Breastfeed - 3 months Hormones - none Menopause - late 52's   Family history Multiple relatives with breast, ovarian, and pancreatic cancers  We evaluated her abdominal symptoms with a CT scan which showed only some mildly wall thickening in the gastric cardia consistent with gastritis. She was referred to GI for endoscopy. EGD biopsies were positive for H. Pylori. She has not started her H. Pylori treatment yet because of issues with obtaining the medication from the pharmacy. She has begun gaining back some wait and her abdominal symptoms seem to be significantly improved.  CLINICAL DATA: 66 y/o F; 66 y/o F; right upper quadrant pain and fever.  EXAM: CT ABDOMEN AND PELVIS WITH CONTRAST  TECHNIQUE: Multidetector CT imaging of the abdomen and pelvis was performed using the standard protocol following bolus  administration of intravenous contrast.  CONTRAST: 157m ISOVUE-300 IOPAMIDOL (ISOVUE-300) INJECTION 61%  COMPARISON: None.  FINDINGS: Lower chest: No acute abnormality.  Hepatobiliary: Subcentimeter well-circumscribed lucencies in the right lobe of liver likely represent small cysts. No other focal liver abnormality. Normal gallbladder. No intra or extrahepatic biliary ductal dilatation.  Pancreas: Unremarkable. No pancreatic ductal dilatation or surrounding inflammatory changes.  Spleen: Normal in size without focal abnormality.  Adrenals/Urinary Tract: Right kidney interpolar lesion measuring 22 x 33 x 28 mm with multiple internal septations (series 2, image 28 and series 3, image 47).  Normal adrenal glands. Subcentimeter left kidney interpolar cyst. No hydronephrosis. Normal bladder.  Stomach/Bowel: Wall thickening within the gastric cardia and proximal body may represent gastritis. No obstructive or inflammatory changes of small and large bowel. Normal appendix.  Vascular/Lymphatic: Aortic atherosclerosis with moderate mixed plaque. No enlarged abdominal or pelvic lymph nodes.  Reproductive: 29 mm uterine myoma.  Other: No abdominal wall hernia or abnormality. No abdominopelvic ascites.  Musculoskeletal: No acute or significant osseous findings.  IMPRESSION: 1. Wall thickening within gastric cardia and proximal body may represent gastritis. 2. 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 Renal protocol CT or MRI is recommended at 6 and 12 months, then yearly for 5 years. This recommendation follows ACR consensus guidelines: Management of the Incidental Renal Mass on CT: A White Paper of the ACR Incidental Findings Committee. J Am Coll Radiol [phone] 3. Aortic atherosclerosis. 4. Myomatous uterus.   Electronically Signed By: LKristine Garbe M.D. On:  02/22/2017 16:32   Diagnosis 1. Surgical [P], ileocecal valve erosions - BENIGN COLORECTAL-TYPE MUCOSA WITH LYMPHOID AGGREGATES. - THERE IS NO EVIDENCE OF COLITIS, DYSPLASIA OR MALIGNANCY. 2. Surgical [P], random sites - BENIGN COLONIC MUCOSA. - NO SIGNIFICANT INFLAMMATION OR OTHER ABNORMALITIES IDENTIFIED. 3. Surgical [P], duodenum bulb - PEPTIC DUODENITIS. - THERE IS NO EVIDENCE OF DYSPLASIA OR MALIGNANCY. 4. Surgical [P], distal stomach - CHRONIC GASTRITIS WITH HELICOBACTER PYLORI ORGANISMS. - THERE IS NO EVIDENCE OF DYSPLASIA OR MALIGNANCY. - SEE COMMENT. 5. Surgical [P], proximal stomach - CHRONIC GASTRITIS WITH HELICOBACTER PYLORI ORGANISMS. - THERE IS NO EVIDENCE OF DYSPLASIA OR MALIGNANCY. - SEE COMMENT. Microscopic Comment 4. Warthin Starry stains performed on parts 4 and 5 highlight the presence of Helicobacter pylori organisms. Enid Cutter MD Pathologist, Electronic Signature (Case signed 04/11/2017)     CLINICAL DATA: Screening.  EXAM: 2D DIGITAL SCREENING BILATERAL MAMMOGRAM WITH CAD AND ADJUNCT TOMO  COMPARISON: Previous exam(s).  ACR Breast Density Category b: There are scattered areas of fibroglandular density.  FINDINGS: In the right breast, a possible mass warrants further evaluation. In the left breast, no findings suspicious for malignancy. Images were processed with CAD.  IMPRESSION: Further evaluation is suggested for possible mass in the right breast.  RECOMMENDATION: Diagnostic mammogram and possibly ultrasound of the right breast. (Code:[id number])  The patient will be contacted regarding the findings, and additional imaging will be scheduled.  BI-RADS CATEGORY 0: Incomplete. Need additional imaging evaluation and/or prior mammograms for comparison.   Electronically Signed By: Nolon Nations M.D. On: 10/05/2016 07:53  CLINICAL DATA: 66 year old female for evaluation of possible right breast mass  on screening mammogram. Patient complains today of diffuse bilateral breast pain.  EXAM: 2D DIGITAL DIAGNOSTIC RIGHT MAMMOGRAM WITH ADJUNCT TOMO  ULTRASOUND RIGHT BREAST  COMPARISON: Previous exam(s).  ACR Breast Density Category b: There are scattered areas of fibroglandular density.  FINDINGS: 2D and 3D spot compression views of the right breast demonstrate a very faint circumscribed oval mass within the upper-outer right breast.  Targeted ultrasound is performed, showing a 4 x 3 x 6 mm circumscribed oval hypoechoic mass at the 10 o'clock position of the right breast 4 cm from the nipple likely representing the mammographic finding. This most likely represents fibrocystic changes or possibly a benign lymph node.  IMPRESSION: 6 mm likely benign mass in the upper-outer right breast. Six-month followup is recommended to ensure stability.  RECOMMENDATION: Right diagnostic mammogram with possible right breast ultrasound in 6 months.  I have discussed the findings, causes of and remedies for breast pain and recommendations with the patient. Results were also provided in writing at the conclusion of the visit. If applicable, a reminder letter will be sent to the patient regarding the next appointment.  BI-RADS CATEGORY 3: Probably benign.   Electronically Signed By: Margarette Canada M.D. On: 10/12/2016 14:59  ADDENDUM REPORT: 02/13/2017 09:09 ADDENDUM: This is an addendum to an MRI he performed on 01/11/2017. In the report it stated that linear enhancement was seen in the upper-outer quadrant of the right breast but should have stated the linear enhancement was in the lower outer quadrant of the right breast. Electronically Signed By: Lillia Mountain M.D. On: 02/13/2017 09:09 Addended by Luvenia Redden, MD on 02/13/2017 9:12 AM  Study Result CLINICAL DATA: Family history of breast cancer in 2 grandmothers, 2 aunts and 2 sisters. Diffuse bilateral breast  pain with discharge. Patient feels lumps under both arms. Probable benign lesion was seen in the right breast on a prior  diagnostic study dated 10/12/2016. LABS: Creatinine was obtained on site at [facility] at [address] Results: Creatinine 0.9 mg/dL. EXAM: BILATERAL BREAST MRI WITH AND WITHOUT CONTRAST TECHNIQUE: Multiplanar, multisequence MR images of both breasts were obtained prior to and following the intravenous administration of 10 ml of MultiHance. THREE-DIMENSIONAL MR IMAGE RENDERING ON INDEPENDENT WORKSTATION: Three-dimensional MR images were rendered by post-processing of the original MR data on an independent workstation. The three-dimensional MR images were interpreted, and findings are reported in the following complete MRI report for this study. Three dimensional images were evaluated at the independent DynaCad workstation COMPARISON: Previous exam(s). FINDINGS: Breast composition: b. Scattered fibroglandular tissue. Background parenchymal enhancement: Moderate. Right breast: In the lateral aspect of the anterior third of the right breast is an indeterminate 4 mm enhancing nodule. In upper outer quadrant of the far posterior aspect of the right breast there is linear enhancement spanning 1.6 cm. Ductal carcinoma in-situ could not be excluded. Adjacent and superolateral to the linear enhancement in the upper-outer quadrant is an indeterminate 5 mm enhancing nodule. Left breast: No mass or abnormal enhancement. Lymph nodes: There is no enlarged axillary adenopathy. Ancillary findings: None. IMPRESSION: Linear enhancement in the far posterior aspect of the upper-outer quadrant of the right breast. Ductal carcinoma in-situ could not be excluded. Two enhancing nodules in the lateral (9 o'clock) area of the right breast and upper outer quadrant. RECOMMENDATION: MR guided core biopsies of the linear enhancement in the right breast as well as the nodule in  the 9 o'clock region of the right breast is recommended. If the MR guided core biopsies are benign I would recommend a short-term interval follow-up MRI in 6 months. BI-RADS CATEGORY 4: Suspicious. Electronically Signed: By: Lillia Mountain M.D. On: 01/11/2017 13:00   CLINICAL DATA: 66 year old female for MRI guided biopsies of an indeterminate area of linear enhancement in the lower, outer right breast and of a right breast mass at 9 o'clock.  EXAM: MRI GUIDED CORE NEEDLE BIOPSY OF THE RIGHT BREAST  TECHNIQUE: Multiplanar, multisequence MR imaging of the right breast was performed both before and after administration of intravenous contrast.  CONTRAST: 27m MULTIHANCE GADOBENATE DIMEGLUMINE 529 MG/ML IV SOLN  COMPARISON: Previous exams.  FINDINGS: I met with the patient, and we discussed the procedure of MRI guided biopsy, including risks, benefits, and alternatives. Specifically, we discussed the risks of infection, bleeding, tissue injury, clip migration, and inadequate sampling. Informed, written consent was given. The usual time out protocol was performed immediately prior to the procedure.  Using sterile technique, 1% Lidocaine, MRI guidance, and a 9 gauge vacuum assisted device, biopsy was performed of the area of linear, non mass enhancement in the lower, outer right breast using a lateral to medial approach. At the conclusion of the procedure, a dumbbell-shaped tissue marker clip was deployed into the biopsy cavity. Follow-up 2-view mammogram was performed and dictated separately.  The right breast mass at 9 o'clock described in the patient's MRI report from 01/11/2017 was not visualized on today's exam to allow for biopsy.  IMPRESSION: 1. MRI guided biopsy of linear, non mass enhancement in the lower, outer right breast. 2. The right breast mass at 9 o'clock described in the patient's MRI report from 01/11/2017 was not visualized on today's exam to  allow for biopsy. A six-month follow-up bilateral breast MRI with contrast is recommended for follow-up of the mass at 9 o'clock and of an additional mass described in the lower, outer right breast on recent MRI.  Electronically Signed: By: Pamelia Hoit M.D. On: 01/26/2017 10:02  ADDENDUM REPORT: 01/27/2017 14:31  ADDENDUM: Pathology revealed ATYPICAL LOBULAR HYPERPLASIA WITH CALCIFICATIONS, SCLEROSING PAPILLARY LESION of the Right breast, lower outer quadrant. This was found to be concordant by Dr. Pamelia Hoit, with excision recommended. Pathology results were discussed with the patient by telephone. The patient reported doing well after the biopsy with tenderness at the site. Post biopsy instructions and care were reviewed and questions were answered. The patient was encouraged to call [facility] for any additional concerns. Surgical consultation has been arranged with Dr. Donnie Mesa at [facility], at the request of Dr. Burney Gauze, on February 13, 2017.  Pathology results reported by Terie Purser, RN on 01/27/2017.   Electronically Signed By: Pamelia Hoit M.D. On: 01/27/2017 14:31  CLINICAL DATA: Status post MRI guided right breast biopsy  EXAM: DIAGNOSTIC RIGHT MAMMOGRAM POST MRI BIOPSY  COMPARISON: Previous exam(s).  FINDINGS: Mammographic images were obtained following MRI guided biopsy of an indeterminate area of linear, non mass enhancement in the lower, outer right breast. Post biopsy mammogram demonstrates the dumbbell-shaped biopsy marker to be in the expected location within the lower, outer right breast, posterior depth.  IMPRESSION: Appropriate marker position as above.  Final Assessment: Post Procedure Mammograms for Marker Placement   Electronically Signed By: Pamelia Hoit M.D. 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 multiple Breast Mass; Local Excision Left. Oral Surgery  Diagnostic Studies History Colonoscopy never Mammogram within last year Pap Smear 1-5 years ago  Allergies  Ibuprofen *ANALGESICS - ANTI-INFLAMMATORY* Nausea, Vomiting.  Social History Alcohol use Occasional alcohol use. Caffeine use Coffee, Tea. No drug use Tobacco use Former smoker.  Family History  Alcohol Abuse Brother, Family Members In [place], Father, Mother. Arthritis Brother, Mother, Sister. Breast Cancer Family Members In General, Sister. Cerebrovascular Accident Mother. Cervical Cancer Family Members In General, Sister. Depression Daughter. Diabetes Mellitus Family Members In General, Mother. Heart Disease Family Members In General, Mother. Heart disease in female family member before age 45 Hypertension Mother. Migraine Headache Father, Sister. Ovarian Cancer Family Members In General. Respiratory Condition Mother. Seizure disorder Brother. Thyroid problems Sister.  Pregnancy / Birth History  Age at menarche 89 years. Age of menopause 25-55 Gravida 1 Irregular periods Length (months) of breastfeeding 3-6 Maternal age 37-25 Para 1  Other Problems  Arthritis Back Pain Depression Heart murmur Hemorrhoids Lump In Breast Migraine Headache Oophorectomy Left.    Vitals  Weight: 119 lb Height: 61in Body Surface Area: 1.52 m Body Mass Index: 22.48 kg/m  Pulse: 77 (Regular)  BP: 120/72 (Sitting, Left Arm, Standard)      Physical Exam   The physical exam findings are as follows: Note:WDWN in NAD Eyes: Pupils equal, round; sclera anicteric HENT: Oral mucosa moist; good dentition Neck: No masses palpated, no thyromegaly Breasts: symmetric; bruising has resolved; no other dominant  masses; no axillary lymphadenopathy; no nipple retraction or discharge Lungs: CTA bilaterally; normal respiratory effort CV: Regular rate and rhythm; no murmurs; extremities well-perfused with no edema Abd: +bowel sounds, soft, mild diffuse tenderness; minimally tender in RUQ, no palpable organomegaly; no palpable hernias Skin: Warm, dry; no sign of jaundice Psychiatric - alert and oriented x 4; calm mood and affect    Assessment & Plan   Invasive ductal carcinoma - right breast  Current  Plans Schedule for Surgery - Right axillary sentinel lymph node biopsy/ ultrasound guided port placement. The surgical procedure has been discussed with the patient. Potential risks, benefits, alternative treatments, and expected outcomes have been explained. All of the patient's questions at this time have been answered. The likelihood of reaching the patient's treatment goal is good. The patient understand the proposed surgical procedure and wishes to proceed.  Deanna Reynolds. Georgette Dover, MD, [facility]  General/ Trauma Surgery  05/16/2017 7:18 AM

## 2017-05-16 NOTE — Progress Notes (Signed)
Nuclear Medicine at bedside.  Patient tolerated procedure well.  She denies any pain.  She is A & O x4.  Nurse remained at bedside.

## 2017-05-17 ENCOUNTER — Encounter (HOSPITAL_BASED_OUTPATIENT_CLINIC_OR_DEPARTMENT_OTHER): Payer: Self-pay | Admitting: Surgery

## 2017-05-17 DIAGNOSIS — C50411 Malignant neoplasm of upper-outer quadrant of right female breast: Secondary | ICD-10-CM | POA: Diagnosis not present

## 2017-05-17 NOTE — Addendum Note (Signed)
Addendum  created 05/17/17 0719 by Mackinsey Pelland, Ernesta Amble, CRNA   Charge Capture section accepted

## 2017-05-24 NOTE — Progress Notes (Signed)
Location of Breast Cancer: Right Breast  Histology per Pathology Report:  01/26/17 Diagnosis Breast, right, needle core biopsy, lower outer quadrant ATYPICAL LOBULAR HYPERPLASIA WITH CALCIFICATIONS SCLEROSING PAPILLARY LESION  04/26/17 Diagnosis Breast, lumpectomy, Right - INVASIVE AND IN SITU DUCTAL CARCINOMA, 0.8 CM. - MARGINS NOT INVOLVED. - ATYPICAL LOBULAR HYPERPLASIA. - COMPLEX SCLEROSING LESION. - FIBROCYSTIC CHANGES WITH SCLEROSING ADENOSIS. - BIOPSY SITE REACTION.  Receptor Status: ER(20%), PR (NEG), Her2-neu (POS), Ki-(70%)  05/16/17 Diagnosis 1. Lymph node, sentinel, biopsy, Right Axillary #1 ONE LYMPH NODE, NEGATIVE FOR CARCINOMA (0/1). 2. Lymph node, sentinel, biopsy, Right Axillary #2 ONE LYMPH NODE, NEGATIVE FOR CARCINOMA (0/1).  Did patient present with symptoms or was this found on screening mammography?: She presented to Dr. Marin Olp in 09/2016 for a strong history of family malignancies. He sent her for a mammogram and next a MRI for evaluation.   Past/Anticipated interventions by surgeon, if any: 04/26/17 Dr. Georgette Dover Procedure: Right radioactive seed localized lumpectomy Surgeon:  TSUEI,MATTHEW K.  05/16/17 Procedure performed: Ultrasound-guided right internal jugular vein port placement Right axillary sentinel lymph node biopsy Blue dye injection Surgeon:TSUEI,MATTHEW K.  Past/Anticipated interventions by medical oncology, if any:  Dr. Marin Olp saw her 09/21/16 and 11/02/16 She has an appointment with him again on 06/12/17  Lymphedema issues, if any:  She had fluid removed from her lymph node removal site. She reports that this area has again started swelling.   Pain issues, if any:  She reports pain a 7/10 in her Right Chest where she had her lymph nodes removed.   SAFETY ISSUES:  Prior radiation? No  Pacemaker/ICD? No  Possible current pregnancy? no  Is the patient on methotrexate? No  Current Complaints / other details:    BP (!) 193/85    Pulse 71   Temp 98.5 F (36.9 C)   Ht 5' 2"  (1.575 m)   Wt 115 lb 9.6 oz (52.4 kg)   SpO2 100% Comment: room air  BMI 21.14 kg/m    Wt Readings from Last 3 Encounters:  05/30/17 115 lb 9.6 oz (52.4 kg)  05/16/17 114 lb 3.2 oz (51.8 kg)  04/26/17 114 lb (51.7 kg)      Tanvi Gatling, Stephani Police, RN 05/24/2017,3:06 PM

## 2017-05-30 ENCOUNTER — Ambulatory Visit
Admission: RE | Admit: 2017-05-30 | Discharge: 2017-05-30 | Disposition: A | Discharge status: Home / Self Care | Payer: Medicare HMO | Source: Ambulatory Visit | Attending: Radiation Oncology | Admitting: Radiation Oncology

## 2017-05-30 ENCOUNTER — Encounter: Payer: Self-pay | Admitting: Radiation Oncology

## 2017-05-30 DIAGNOSIS — C50511 Malignant neoplasm of lower-outer quadrant of right female breast: Secondary | ICD-10-CM

## 2017-05-30 DIAGNOSIS — Z170 Estrogen receptor positive status [ER+]: Principal | ICD-10-CM

## 2017-05-30 NOTE — Progress Notes (Addendum)
Radiation Oncology         [phone]) [phone] ________________________________  Initial outpatient Consultation  Name: Deanna Reynolds MRN: [id number]  Date: 05/30/2017  DOB: [birth year]  [id number], Iona Beard, MD  Armandina Gemma, MD   REFERRING PHYSICIAN: Armandina Gemma, MD  DIAGNOSIS:    ICD-10-CM   1. 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 female who presented in February 2018 with a strong family history of malignancy. A mammogram was performed and showed a lesion at the 10:00 position in the right breast. A [place] was then performed showing 4 x 3 x 6 mm hypo-echoic mass felt to be fibrocystic changes.   MRI on 01/11/17 showed linear enhancement in the lower outer quadrant of the right breast as well as two enhancing nodules in the 9 o'clock area of the right breast and upper outer quadrant.  MR guided biopsy on 01/26/17 revealed atypical lobular hyperplasia with calcifications, sclerosing papillary lesion of the right breast, lower outer quadrant. The right breast mass at 9:00 was not visualized on MRI at time of biopsy.  Patient underwent right breast lumpectomy on 04/26/17 and right axillary sentinel node biopsy on 05/16/17, both performed by Dr. Georgette Dover. Pathology revealed 0.8 cm tumor with invasive ductal carcinoma and DCIS found away from previous biopsy site. Margins were negative by at least 1 mm to invasive carcinoma and less than 1 mm to DCIS. These margins are close anteriorly. Both lymph nodes were negative for malignancy. She is getting reviewed at tumor board tomorrow.  Patient has since had fluid removed from her lymph node removal site but continues to endorse swelling. She reports  pain of 7/10 at right chest where nodes were removed. Swelling is migrating down arm and patient is having trouble sleeping. She uses compression sleeve but this is insufficient. She endorses fevers up to 102 which took place on Sunday. She denies any other symptoms. Endorses depressed appetite. Denies antibiotic use. Endorses intermittent headaches, most likely tension headaches. Denies trouble swallowing, lumps or bumps in neck, or heart palpitations. Denies trouble with urination, bowel movements.  Endorses hx of endometriosis. Discusses importance of Christian faith.  The patient states Dr. Marin Olp has mentioned starting chemotherapy with lowest available dosage with 12 infusions over the course of one year.  PREVIOUS RADIATION THERAPY: No  PAST MEDICAL HISTORY:  has a past medical history of Cancer ([place]); DDD (degenerative disc disease), cervical; Depression; Gastritis; Hypertension; and Neuromuscular disorder ([place]).    PAST SURGICAL HISTORY: Past Surgical History:  Procedure Laterality Date  . AXILLARY SENTINEL NODE BIOPSY Right 05/16/2017   Procedure: RIGHT AXILLARY SENTINEL LYMPH NODE BIOPSY ERAS PATHWAY;  Surgeon: Donnie Mesa, MD;  Location: [place];  Service: General;  Laterality: Right;  PEC BLOCK  . BREAST LUMPECTOMY WITH RADIOACTIVE SEED LOCALIZATION Right 04/26/2017   Procedure: RIGHT BREAST LUMPECTOMY WITH RADIOACTIVE SEED LOCALIZATION ERAS PATHWAY;  Surgeon: Donnie Mesa, MD;  Location: [place];  Service: General;  Laterality: Right;  . BREAST SURGERY Bilateral    multiple breast biopies  . PORTACATH PLACEMENT Right 05/16/2017   Procedure: INSERTION PORT-A-CATH WITH ULTRASOUND GUIDANCE;  Surgeon: Donnie Mesa, MD;  Location: [place];  Service:  General;  Laterality: Right;  . TUBAL LIGATION      FAMILY HISTORY: family history includes Breast cancer in her cousin, maternal aunt, maternal grandmother, mother, and sister;  Leukemia in her father.  SOCIAL HISTORY:  reports that she quit smoking about 2 years ago. Her smoking use included Cigarettes. She has a 1.25 pack-year smoking history. She has never used smokeless tobacco. She reports that she does not drink alcohol or use drugs.  ALLERGIES: Motrin [ibuprofen]  MEDICATIONS:  Current Outpatient Prescriptions  Medication Sig Dispense Refill  . Ascorbic Acid (VITAMIN C) 1000 MG tablet Take 1,000 mg by mouth daily.    . Cholecalciferol (VITAMIN D-3) 5000 units TABS Take 1 tablet by mouth daily.    . Cyanocobalamin (VITAMIN B-12 PO) Take 1 tablet by mouth daily.    . Flaxseed, Linseed, (FLAX SEED OIL PO) Take by mouth. One teaspoon daily    . hydrochlorothiazide (HYDRODIURIL) 25 MG tablet 25 mg.    . HYDROcodone-acetaminophen (NORCO/VICODIN) 5-325 MG tablet Take 1 tablet by mouth every 4 (four) hours as needed for moderate pain. 30 tablet 0  . losartan (COZAAR) 100 MG tablet     . MAGNESIUM PO Take 1 tablet by mouth daily.    Marland Kitchen VITAMIN E PO Take 1 tablet by mouth daily.    Marland Kitchen HYDROcodone-acetaminophen (NORCO/VICODIN) 5-325 MG tablet Take 1-2 tablets by mouth every 6 (six) hours as needed for moderate pain. (Patient not taking: Reported on 05/30/2017) 30 tablet 0  . zolpidem (AMBIEN CR) 6.25 MG CR tablet Take 1 tablet (6.25 mg total) by mouth at bedtime as needed for sleep. (Patient not taking: Reported on 05/30/2017) 20 tablet 0   No current facility-administered medications for this encounter.     REVIEW OF SYSTEMS: A 10+ POINT REVIEW OF SYSTEMS WAS OBTAINED including neurology, dermatology, psychiatry, cardiac, respiratory, lymph, extremities, GI, GU, Musculoskeletal, constitutional, breasts, reproductive, HEENT.  All pertinent positives are noted in the HPI.  All others are negative.   PHYSICAL EXAM:  height is _0  (1.575 m) and weight is 115 lb 9.6 oz (52.4 kg). Her temperature is 98.5 F (36.9 C). Her blood pressure is 193/85 (abnormal) and her pulse is  71. Her oxygen saturation is 100%.   General: Alert and oriented, in no acute distress HEENT: Head is normocephalic. Extraocular movements are intact. Oropharynx is clear. Neck: Neck is supple, no palpable cervical or supraclavicular lymphadenopathy. Heart: Regular in rate and rhythm with no murmurs, rubs, or gallops. Chest: Clear to auscultation bilaterally, with no rhonchi, wheezes, or rales. Abdomen: Soft, nontender, nondistended, with no rigidity or guarding. Extremities: No cyanosis or edema. Lymphatics: see Neck Exam Skin: She has a crusted lesion that is nonspecific over the tip of her nose. Musculoskeletal: symmetric strength and muscle tone throughout. Neurologic: Cranial nerves II through XII are grossly intact. No obvious focalities. Speech is fluent. Coordination is intact. Psychiatric: Judgment and insight are intact. Affect is appropriate. Breasts: She has a moderate seroma which is tender at the right axilla. No significant seroma in the lower outer quadrant of the right breast.    ECOG = 0  0 - Asymptomatic (Fully active, able to carry on all predisease activities without restriction)  1 - Symptomatic but completely ambulatory (Restricted in physically strenuous activity but ambulatory and able to carry out work of a light or sedentary nature. For example, light housework, office work)  2 - Symptomatic, <50% in bed during the day (Ambulatory and capable of all self care  but unable to carry out any work activities. Up and about more than 50% of waking hours)  3 - Symptomatic, >50% in bed, but not bedbound (Capable of only limited self-care, confined to bed or chair 50% or more of waking hours)  4 - Bedbound (Completely disabled. Cannot carry on any self-care. Totally confined to bed or chair)  5 - Death   Eustace Pen MM, Creech RH, Tormey DC, et al. [phone]). "Toxicity and response criteria of the [facility]". [place] Oncol. 5 (6):  649-55   LABORATORY DATA:  Lab Results  Component Value Date   WBC 7.7 11/02/2016   HGB 12.4 11/02/2016   HCT 37.2 11/02/2016   MCV 88 11/02/2016   PLT 214 11/02/2016   CMP     Component Value Date/Time   NA 138 04/21/2017 1204   NA 137 11/02/2016 1421   K 3.7 04/21/2017 1204   K 3.6 11/02/2016 1421   CL 105 04/21/2017 1204   CL 101 11/02/2016 1421   CO2 25 04/21/2017 1204   CO2 28 11/02/2016 1421   GLUCOSE 88 04/21/2017 1204   BUN 10 04/21/2017 1204   BUN 11 11/02/2016 1421   CREATININE 0.86 04/21/2017 1204   CREATININE 0.63 11/02/2016 1421   CALCIUM 9.5 04/21/2017 1204   CALCIUM 9.4 11/02/2016 1421   PROT 7.4 11/02/2016 1421   ALBUMIN 4.2 11/02/2016 1421   AST 14 11/02/2016 1421   ALT 10 11/02/2016 1421   ALKPHOS 57 11/02/2016 1421   BILITOT 0.3 11/02/2016 1421   GFRNONAA >60 04/21/2017 1204   GFRAA >60 04/21/2017 1204         RADIOGRAPHY: Dg Chest Port 1 View  Result Date: 05/16/2017 CLINICAL DATA:  Central line placement.  Breast cancer. EXAM: PORTABLE CHEST 1 VIEW COMPARISON:  None. FINDINGS: Right Port-A-Cath has been placed. The tip is in the mid to lower right atrium approximately 6 cm below the cavoatrial junction. No pneumothorax. Lungs are clear. Heart is normal size. No effusions. IMPRESSION: Right Port-A-Cath tip in the mid to lower right atrium 6 cm below the cavoatrial junction. No pneumothorax. Electronically Signed   By: Rolm Baptise M.D.   On: 05/16/2017 09:21   Dg Fluoro Guide Cv Line-no Report  Result Date: 05/16/2017 Fluoroscopy was utilized by the requesting physician.  No radiographic interpretation.      IMPRESSION/PLAN:  Right breast cancer  It was a pleasure meeting the patient today. We discussed the risks, benefits, and side effects of radiotherapy. I recommend radiotherapy to the right breast to reduce her risk of locoregional recurrence by 2/3.  We discussed that radiation would take approximately 4 weeks to complete and that I would  give the patient a few weeks to heal following surgery before starting treatment planning. If chemotherapy were to be given, this would precede radiotherapy. We spoke about acute effects including skin irritation and fatigue as well as much less common late effects including internal organ injury or irritation. We spoke about the latest technology that is used to minimize the risk of late effects for patients undergoing radiotherapy to the breast or chest wall. No guarantees of treatment were given. The patient is enthusiastic about proceeding with treatment. I look forward to participating in the patient's care.  I will await her referral back to me by Dr Marin Olp for postoperative follow-up and eventual CT simulation/treatment planning.  Advised to call Dr Vonna Kotyk nurse to discuss prior fever and severe pain in right axilla.  Swelling  does not appear excessive today, and I see no active sign of infection today. She is afebrile.  Will refer for genetic testing. Patient has discussed with someone over the phone. She has a daughter. I will make referral to genetics just in case someone else doesn't  She is scheduled for appt. with Dr Marin Olp in a couple weeks. Will reach out Iris Pert to potentially move up appt to get things going with systemic therapy      __________________________________________   Eppie Gibson, MD   This document serves as a record of services personally performed by Eppie Gibson, MD. It was created on his behalf by Linward Natal, a trained medical scribe. The creation of this record is based on the scribe's personal observations and the provider's statements to them. This document has been checked and approved by the attending provider.

## 2017-05-31 ENCOUNTER — Other Ambulatory Visit: Payer: Self-pay | Admitting: Radiation Oncology

## 2017-05-31 ENCOUNTER — Telehealth: Payer: Self-pay | Admitting: Hematology & Oncology

## 2017-05-31 DIAGNOSIS — C50511 Malignant neoplasm of lower-outer quadrant of right female breast: Secondary | ICD-10-CM | POA: Insufficient documentation

## 2017-05-31 DIAGNOSIS — Z170 Estrogen receptor positive status [ER+]: Principal | ICD-10-CM

## 2017-05-31 NOTE — Telephone Encounter (Signed)
Scheduled appt per 10/24 sch message - patient is aware and reminder letter sent in the mail.

## 2017-06-08 ENCOUNTER — Other Ambulatory Visit: Payer: Self-pay | Admitting: *Deleted

## 2017-06-08 MED ORDER — LIDOCAINE-PRILOCAINE 2.5-2.5 % EX CREA: 1.0000 "application " | TOPICAL_CREAM | CUTANEOUS | 0 refills | Status: AC | PRN

## 2017-06-12 ENCOUNTER — Ambulatory Visit (HOSPITAL_BASED_OUTPATIENT_CLINIC_OR_DEPARTMENT_OTHER): Payer: 59 | Admitting: Hematology & Oncology

## 2017-06-12 ENCOUNTER — Other Ambulatory Visit (HOSPITAL_BASED_OUTPATIENT_CLINIC_OR_DEPARTMENT_OTHER): Payer: 59

## 2017-06-12 VITALS — BP 136/82 | HR 77 | Temp 98.6°F | Resp 20 | Wt 114.0 lb

## 2017-06-12 DIAGNOSIS — Z7189 Other specified counseling: Secondary | ICD-10-CM | POA: Diagnosis not present

## 2017-06-12 DIAGNOSIS — Z170 Estrogen receptor positive status [ER+]: Secondary | ICD-10-CM | POA: Diagnosis not present

## 2017-06-12 DIAGNOSIS — C50511 Malignant neoplasm of lower-outer quadrant of right female breast: Secondary | ICD-10-CM

## 2017-06-12 DIAGNOSIS — N63 Unspecified lump in unspecified breast: Secondary | ICD-10-CM

## 2017-06-12 DIAGNOSIS — Z809 Family history of malignant neoplasm, unspecified: Secondary | ICD-10-CM

## 2017-06-12 DIAGNOSIS — Z803 Family history of malignant neoplasm of breast: Secondary | ICD-10-CM | POA: Diagnosis not present

## 2017-06-12 LAB — CBC WITH DIFFERENTIAL (CANCER CENTER ONLY)
BASO#: 0 10*3/uL (ref 0.0–0.2)
BASO%: 0.3 % (ref 0.0–2.0)
EOS%: 2.1 % (ref 0.0–7.0)
Eosinophils Absolute: 0.1 10*3/uL (ref 0.0–0.5)
HCT: 35 % (ref 34.8–46.6)
HGB: 11.7 g/dL (ref 11.6–15.9)
LYMPH#: 2.4 10*3/uL (ref 0.9–3.3)
LYMPH%: 35.3 % (ref 14.0–48.0)
MCH: 30.2 pg (ref 26.0–34.0)
MCHC: 33.4 g/dL (ref 32.0–36.0)
MCV: 90 fL (ref 81–101)
MONO#: 0.6 10*3/uL (ref 0.1–0.9)
MONO%: 8.1 % (ref 0.0–13.0)
NEUT#: 3.7 10*3/uL (ref 1.5–6.5)
NEUT%: 54.2 % (ref 39.6–80.0)
PLATELETS: 228 10*3/uL (ref 145–400)
RBC: 3.87 10*6/uL (ref 3.70–5.32)
RDW: 12.6 % (ref 11.1–15.7)
WBC: 6.8 10*3/uL (ref 3.9–10.0)

## 2017-06-12 LAB — COMPREHENSIVE METABOLIC PANEL (CC13)
A/G RATIO: 1.2 (ref 1.2–2.2)
ALT: 9 IU/L (ref 0–32)
AST: 16 IU/L (ref 0–40)
Albumin, Serum: 4.3 g/dL (ref 3.6–4.8)
Alkaline Phosphatase, S: 62 IU/L (ref 39–117)
BUN/Creatinine Ratio: 14 (ref 12–28)
BUN: 10 mg/dL (ref 8–27)
Bilirubin Total: <0.2 mg/dL (ref 0.0–1.2)
CALCIUM: 9.9 mg/dL (ref 8.7–10.3)
CO2: 28 mmol/L (ref 20–29)
CREATININE: 0.74 mg/dL (ref 0.57–1.00)
Chloride, Ser: 104 mmol/L (ref 96–106)
GFR calc Af Amer: 98 mL/min/{1.73_m2} (ref >59)
GFR, EST NON AFRICAN AMERICAN: 85 mL/min/{1.73_m2} (ref >59)
GLUCOSE: 94 mg/dL (ref 65–99)
Globulin, Total: 3.5 g/dL (ref 1.5–4.5)
POTASSIUM: 4.2 mmol/L (ref 3.5–5.2)
Sodium: 139 mmol/L (ref 134–144)
TOTAL PROTEIN: 7.8 g/dL (ref 6.0–8.5)

## 2017-06-12 LAB — CHCC SATELLITE - SMEAR

## 2017-06-12 MED ORDER — DEXAMETHASONE 4 MG PO TABS: ORAL_TABLET | ORAL | 0 refills | Status: DC

## 2017-06-12 NOTE — Progress Notes (Signed)
Hematology and Oncology Follow Up Visit  Deanna Reynolds [id number] Dec 07, 1950 66 y.o. 06/12/2017   Principle Diagnosis:   Stage IA (T1bN0M0) infiltrating ductal carcinoma of the RIGHT breast -- ER+/PR-/HER2+  Current Therapy:    TCHP - cycle #1 to start on 11/6     Interim History:  Deanna Reynolds is back for follow-up.  Surprisingly enough, she actually does have breast cancer.  Given her incredible family history of breast cancer, we had her evaluated thoroughly.  She did have a mammogram which was felt to show a benign lesion in the right breast.  She then went on to an MRI of the bilateral breast tissue.  This showed abnormal enhancement in the right breast in the right lower outer quadrant.  Core biopsies were taken.  She is found to have atypical lobular hyperplasia in the right breast.  She was taken to surgery on September 19.  She had a right lumpectomy.  The pathology report ([id number]) showed invasive and in situ ductal carcinoma.  This measured 8 mm.  There was atypical lobular hyperplasia.  All margins were negative.  She was found to have a tumor that was ER positive/PR negative/HER-2 positive.  She then underwent a sentinel node biopsy.  This was done on October 9.  The pathology report ([id number] showed 2- lymph nodes.0)  She is thus staged as a stage IA (T1bN0M0) infiltrating ductal carcinoma.  She looks fantastic.  She was not surprised that she had breast cancer.  She is going for genetic testing next week.  Dr. Georgette Dover already placed a Port-A-Cath.  As always, his skill is incredible.  She has had no problems with hot flashes or sweats.  She has had no nausea or vomiting.  She has had no cough.  She has had no change in bowel or bladder habits.  She had a chest x-ray with the Port-A-Cath.  This looked fine.  She has no bony pain.  She has normal liver tests.  Overall, her performance status is ECOG 0  Medications:  Current Outpatient Medications:  .  Ascorbic  Acid (VITAMIN C) 1000 MG tablet, Take 1,000 mg by mouth daily., Disp: , Rfl:  .  Cholecalciferol (VITAMIN D-3) 5000 units TABS, Take 1 tablet by mouth daily., Disp: , Rfl:  .  Cyanocobalamin (VITAMIN B-12 PO), Take 1 tablet by mouth daily., Disp: , Rfl:  .  Flaxseed, Linseed, (FLAX SEED OIL PO), Take by mouth. One teaspoon daily, Disp: , Rfl:  .  hydrochlorothiazide (HYDRODIURIL) 25 MG tablet, 25 mg., Disp: , Rfl:  .  lidocaine-prilocaine (EMLA) cream, Apply 1 application topically as needed., Disp: 30 g, Rfl: 0 .  losartan (COZAAR) 100 MG tablet, , Disp: , Rfl:  .  MAGNESIUM PO, Take 1 tablet by mouth daily., Disp: , Rfl:  .  VITAMIN E PO, Take 1 tablet by mouth daily., Disp: , Rfl:  .  HYDROcodone-acetaminophen (NORCO/VICODIN) 5-325 MG tablet, Take 1-2 tablets by mouth every 6 (six) hours as needed for moderate pain. (Patient not taking: Reported on 05/30/2017), Disp: 30 tablet, Rfl: 0 .  HYDROcodone-acetaminophen (NORCO/VICODIN) 5-325 MG tablet, Take 1 tablet by mouth every 4 (four) hours as needed for moderate pain. (Patient not taking: Reported on 06/12/2017), Disp: 30 tablet, Rfl: 0 .  zolpidem (AMBIEN CR) 6.25 MG CR tablet, Take 1 tablet (6.25 mg total) by mouth at bedtime as needed for sleep. (Patient not taking: Reported on 05/30/2017), Disp: 20 tablet, Rfl: 0  Allergies:  Allergies  Allergen Reactions  .  Motrin [Ibuprofen] Nausea And Vomiting    Past Medical History, Surgical history, Social history, and Family History were reviewed and updated.  Review of Systems: Review of Systems  Constitutional: Negative for appetite change, fatigue, fever and unexpected weight change.  HENT:   Negative for lump/mass, mouth sores, sore throat and trouble swallowing.   Respiratory: Negative for cough, hemoptysis and shortness of breath.   Cardiovascular: Negative for leg swelling and palpitations.  Gastrointestinal: Negative for abdominal distention, abdominal pain, blood in stool,  constipation, diarrhea, nausea and vomiting.  Genitourinary: Negative for bladder incontinence, dysuria, frequency and hematuria.   Musculoskeletal: Negative for arthralgias, back pain, gait problem and myalgias.  Skin: Negative for itching and rash.  Neurological: Negative for dizziness, extremity weakness, gait problem, headaches, numbness, seizures and speech difficulty.  Hematological: Does not bruise/bleed easily.  Psychiatric/Behavioral: Negative for depression and sleep disturbance. The patient is not nervous/anxious.     Physical Exam:  weight is 114 lb (51.7 kg). Her oral temperature is 98.6 F (37 C). Her blood pressure is 136/82 and her pulse is 77. Her respiration is 20.   Wt Readings from Last 3 Encounters:  06/12/17 114 lb (51.7 kg)  05/30/17 115 lb 9.6 oz (52.4 kg)  05/16/17 114 lb 3.2 oz (51.8 kg)   Petite African-American female in no obvious distress.  Head neck exam shows no ocular or oral lesions.  There are no palpable cervical or supraclavicular lymph nodes.  Lungs are clear bilaterally.  Cardiac exam regular rate and rhythm with no murmurs, rubs or bruits.  Abdomen is soft.  She has good bowel sounds.  There is no fluid wave.  There is no palpable liver or spleen tip.  Breast exam shows left breast with no masses, edema or erythema.  There is no left axillary adenopathy.  Right breast shows the well-healed lumpectomy at the 7 o'clock position just adjacent to the areola.  There is some tenderness at the lumpectomy site.  There is some slight ecchymoses.  She has no obvious mass.  There is no right axillary adenopathy.  Back exam shows no tenderness over the spine, ribs or hips.  Extremities shows no clubbing, cyanosis or edema.  Skin exam shows no rashes, ecchymoses or petechia.   Lab Results  Component Value Date   WBC 6.8 06/12/2017   HGB 11.7 06/12/2017   HCT 35.0 06/12/2017   MCV 90 06/12/2017   PLT 228 06/12/2017     Chemistry      Component Value Date/Time     NA 138 04/21/2017 1204   NA 137 11/02/2016 1421   K 3.7 04/21/2017 1204   K 3.6 11/02/2016 1421   CL 105 04/21/2017 1204   CL 101 11/02/2016 1421   CO2 25 04/21/2017 1204   CO2 28 11/02/2016 1421   BUN 10 04/21/2017 1204   BUN 11 11/02/2016 1421   CREATININE 0.86 04/21/2017 1204   CREATININE 0.63 11/02/2016 1421      Component Value Date/Time   CALCIUM 9.5 04/21/2017 1204   CALCIUM 9.4 11/02/2016 1421   ALKPHOS 57 11/02/2016 1421   AST 14 11/02/2016 1421   ALT 10 11/02/2016 1421   BILITOT 0.3 11/02/2016 1421         Impression and Plan: Deanna Reynolds is a 66 year old postmenopausal African-American female with a newly diagnosed stage IA infiltrating ductal carcinoma of the right breast.  She has an incredible family history of breast cancer.  I would have to believe that she will  have a breast cancer gene.  She has a daughter.  This is important that Deanna Reynolds be tested.  If she is positive, that her daughter will have to be tested.  Her daughter is 78 years old.  I told Deanna Reynolds that if her daughter tested positive, then I would recommend a bilateral mastectomy and oophorectomy as she is through childbearing.  Even though this is an early stage breast cancer, I believe that chemotherapy is indicated given that she is HER-2 positive.  I would favor using Taxotere/Cytoxan/Herceptin/Perjeta (TCHP). I believe this should be very effective.  I talked to Deanna Reynolds for about an hour.  I gave her information sheets about the treatments.  I went over the toxicity.  She understands that she will lose her hair.  This is not a problem for her.  I think that 4 cycles is all that she needs.  As far as the Herceptin/Perjeta by itself, given that she has negative lymph nodes, she might get away with a 6-1-monthcourse of therapy.  I feel that with aggressive intervention, we can decrease her risk of recurrence down to less than 10%.  She will need radiation therapy.  This can be given once  the chemotherapy is given.  She will also need hormonal therapy.  Once radiation is completed, I would favor Femara.  She also might benefit from Prolia.  This has been shown to be helpful in postmenopausal women with ER positive cancer in decreasing bone metastasis.  I must say that Deanna Reynolds quite enthusiastic to start treatment.  We will see about getting started this week.  I will plan to see her back myself on the day of her second cycle of treatment.        EVolanda Napoleon MD 11/5/20184:32 PM

## 2017-06-12 NOTE — Progress Notes (Signed)
START OFF PATHWAY REGIMEN - Breast   [id number]:Docetaxel + Cyclophosphamide (TC):   A cycle is every 21 days:     Docetaxel      Cyclophosphamide   **Always confirm dose/schedule in your pharmacy ordering system**    Patient Characteristics: Postoperative without Neoadjuvant Therapy (Pathologic Staging), Invasive Disease, Adjuvant Therapy, HER2 Positive, ER Positive, Node Negative, pT1b, pN0/N52m, Chemotherapy Indicated Therapeutic Status: Postoperative without Neoadjuvant Therapy (Pathologic Staging) AJCC Grade: Staged < 8th Ed. AJCC N Category: Staged < 8th Ed. AJCC M Category: Staged < 8th Ed. ER Status: Positive (+) AJCC 8 Stage Grouping: Staged < 8th Ed. HER2 Status: Positive (+) Oncotype Dx Recurrence Score: Ordered Other Genomic Test AJCC T Category: Staged < 8th Ed. PR Status: Negative (-) Intervention Indicated: Chemotherapy Intent of Therapy: Curative Intent, Discussed with Patient

## 2017-06-13 ENCOUNTER — Other Ambulatory Visit: Payer: Self-pay | Admitting: *Deleted

## 2017-06-13 LAB — LACTATE DEHYDROGENASE: LDH: 188 U/L (ref 125–245)

## 2017-06-13 MED ORDER — DEXAMETHASONE 4 MG PO TABS: ORAL_TABLET | ORAL | 0 refills | Status: DC

## 2017-06-14 ENCOUNTER — Ambulatory Visit: Payer: 59

## 2017-06-16 ENCOUNTER — Telehealth: Payer: Self-pay

## 2017-06-16 NOTE — Telephone Encounter (Signed)
Pt called to ask what her appt is about. Genetic counseling. Instructions on getting to [place] given.

## 2017-06-20 ENCOUNTER — Other Ambulatory Visit: Payer: 59

## 2017-06-20 ENCOUNTER — Encounter: Payer: Self-pay | Admitting: Genetics

## 2017-06-20 ENCOUNTER — Ambulatory Visit (HOSPITAL_BASED_OUTPATIENT_CLINIC_OR_DEPARTMENT_OTHER): Payer: 59 | Admitting: Genetics

## 2017-06-20 DIAGNOSIS — Z7183 Encounter for nonprocreative genetic counseling: Secondary | ICD-10-CM

## 2017-06-20 DIAGNOSIS — Z800 Family history of malignant neoplasm of digestive organs: Secondary | ICD-10-CM | POA: Diagnosis not present

## 2017-06-20 DIAGNOSIS — Z806 Family history of leukemia: Secondary | ICD-10-CM | POA: Diagnosis not present

## 2017-06-20 DIAGNOSIS — Z803 Family history of malignant neoplasm of breast: Secondary | ICD-10-CM | POA: Insufficient documentation

## 2017-06-20 DIAGNOSIS — Z8041 Family history of malignant neoplasm of ovary: Secondary | ICD-10-CM | POA: Diagnosis not present

## 2017-06-20 DIAGNOSIS — Z170 Estrogen receptor positive status [ER+]: Principal | ICD-10-CM

## 2017-06-20 DIAGNOSIS — C50511 Malignant neoplasm of lower-outer quadrant of right female breast: Secondary | ICD-10-CM

## 2017-06-20 NOTE — Progress Notes (Addendum)
REFERRING PROVIDER: Volanda Napoleon, MD [address] [place], [place] [address]  PRIMARY PROVIDER:  Benito Mccreedy, MD  PRIMARY REASON FOR VISIT:  1. 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 Family history of breast cancer   3. Family history of leukemia   4. Family history of stomach cancer   5. Family history of ovarian cancer    HISTORY OF PRESENT ILLNESS:   Deanna Reynolds, a 66 y.o. female, was seen for a [place] cancer genetics consultation at the request of Dr. Marin Olp due to a personal and family history of cancer.  Deanna Reynolds presents to clinic today to discuss the possibility of a hereditary predisposition to cancer, genetic testing, and to further clarify her future cancer risks, as well as potential cancer risks for family members.   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  Will be having adjuvant chemotherapy.  ER+, PR-, HER2+.    HORMONAL RISK FACTORS:  Menarche was at age 70.  First live birth at age 22.  Ovaries intact: yes.  Hysterectomy: no. Reports she had 1 fallopian tube removed Menopausal status: postmenopausal.  Colonoscopy: yes; normal.  Past Medical History:  Diagnosis Date  . Cancer [facility])    breast cancer of right sided  . DDD (degenerative disc disease), cervical   . Depression   . Family history of breast cancer   . Family history of leukemia   . Family history of ovarian cancer   . Family history of stomach cancer   . Gastritis   . Hypertension   . 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 BREAST SURGERY Bilateral    multiple breast biopies  . TUBAL LIGATION      Social History   Socioeconomic History  . Marital status: Divorced    Spouse name: Not on file  . Number of children: 1  . Years of education: Not on file  . Highest  education level: Not on file  Social Needs  . Financial resource strain: Not on file  . Food insecurity - worry: Not on file  . Food insecurity - inability: Not on file  . Transportation needs - medical: Not on file  . Transportation needs - non-medical: Not on file  Occupational History  . Occupation: retired Biomedical scientist  Tobacco Use  . Smoking status: Former Smoker    Packs/day: 0.25    Years: 5.00    Pack years: 1.25    Types: Cigarettes    Last attempt to quit: 08/21/2014    Years since quitting: 2.8  . Smokeless tobacco: Never Used  Substance and Sexual Activity  . Alcohol use: No  . Drug use: No  . Sexual activity: Not Currently  Other Topics Concern  . Not on file  Social History Narrative  . Not on file     FAMILY HISTORY:  We obtained a detailed, 4-generation family history.  Significant diagnoses are listed below:  Family History  Problem Relation Age of Onset  . COPD Mother   . Breast cancer Sister 32  . Ovarian cancer Maternal Grandmother 38  . Cancer Cousin 45       type unk  . Leukemia Father 39  . Stomach cancer Brother 53  . Breast cancer Paternal Aunt 62  .  Cancer Maternal Grandfather        type unk, was quick (died in 1 month after dx)  . Breast cancer Paternal Grandmother 2  . Stomach cancer Sister 22  . Breast cancer Paternal Aunt        dx <50  . Breast cancer Paternal Aunt        dx <50  . Breast cancer Paternal Aunt        dx <50  . Breast cancer Other   . Cancer Other 27       metastatic- site of origin unk  . Colon cancer Neg Hx     Deanna Reynolds has a 72 year-old daughter with no history of cancer.  This daughter has a 69 year-old son with no history of cancer.  Deanna Reynolds has 4 brothers and 5 sisters listed below: -1 brother died due to trauma.  He had a child, no information is known about this child.  -2 brothers are in their 106's with no history of cancer.  They both had children, no history of cancer.  -1 brother was  diagnosed with stomach cancer in his late 66's and is now 30.  He has a son and a daughter.  -1 sister was diagnosed with breast cancer in her 21's and is now in her 28's.  She has 1 son and 1 daughter.  -1 sister is currently going through treatment for stomach cancer diagnosed in her 27's, she is now 89.  This sister has 2 daughters and a son with no history of cancer.  One of these daughters had a daughter (pt. Niece's daughter) with something identified in her breast at age 64.  She is unsure if it is cancer.  -1 sister is in her 54's with no history of cancer an no children. She has thyroid issues.  -1 sister has a health condition but is 'in denial and will not be seen by a doctor for it.  She had a daughter who died of metastatic cancer ( unk origin) at 83.   -1 sister is in her 79's with no history of cancer.  She has children, no history of cancer.   Deanna Reynolds father died in his 60's of leukemia.  He had 5 maternal half- sisters and several maternal half- brothers (no information known about the brothers). -1 paternal half-aunt is 23 and had a bilateral mastectomy to treat breast cancer dx. In her late 59's.  She had a daughter who had cancer (type unk) in her 35's.  This daughter had a son who is currently having treatment for an unknown type of cancer in his 9's.   -3 other paternal half aunts were diagnosed with breast cancer under the age of 21 and are all deceased.   -1 paternal half aunt has no history of cancer.  Deanna Reynolds has other paternal cousins without any history of cancer.  Deanna Reynolds paternal grandmother had breast cancer in her 82's.  Her maternal great grandmother also had breast cancer, age unk.  No information is known about her paternal grandfather.    Deanna Reynolds mother is 51 and has COPD. She is an only child.  Deanna Reynolds maternal grandmother had ovarian cancer at 48. And her maternal grandfather also had cancer, type unknown. She reports that he died 1 month after dx  of this cancer.   Deanna Reynolds is unaware of previous family history of genetic testing for hereditary cancer risks. She reports she has talked to her  family about genetic testing for years, but none of her relatives have pursued it to her knowledge. Patient's maternal ancestors are of Black/African American descent, and paternal ancestors are of Native American/Caucasian descent. There is no reported Ashkenazi Jewish ancestry. There is no known consanguinity.  GENETIC COUNSELING ASSESSMENT: Deanna Reynolds is a 66 y.o. female with a personal and family history which is somewhat suggestive of a Hereditary Cancer Predisposition Syndrome. We, therefore, discussed and recommended the following at today's visit.   DISCUSSION: We reviewed the characteristics, features and inheritance patterns of hereditary cancer syndromes. We also discussed genetic testing, including the appropriate family members to test, the process of testing, insurance coverage and turn-around-time for results. We discussed the implications of a negative, positive and/or variant of uncertain significant result. We recommended Deanna Reynolds pursue genetic testing for the common Hereditary Cancer  gene panel + Myelodysplastic syndromes/Leukemia Panel.    [facility] Common Hereditary Cancers Panel (47)Genes APC, ATM, AXIN2, BARD1, BMPR1A, BRCA1, BRCA2, BRIP1, CDH1, CDK4, CDKN2A, CHEK2, CTNNA1, DICER1, EPCAM, GREM1, HOXB13, KIT, MEN1, MLH1, MSH2, MSH3, MSH6, MUTYH, NBN, NF1, NTHL1, PALB2, PDGFRA, PMS2, POLD1, POLE, PTEN, RAD50, RAD51C, RAD51D, SDHA, SDHB, SDHC, SDHD, SMAD4, SMARCA4, STK11, TP53, TSC1, TSC2, VHL  [facility] Gastric Cancer Panel (19)Genes APC, BMPR1A, CDH1, CTNNA1, EPCAM, KIT, MLH1, MSH2, MSH6, NF1, PDGFRA, PMS2, SDHA, SDHB, SDHC, SDHD, SMAD4, STK11, TP53  [facility] Myelodysplastic Syndrome/Leukemia Panel (16)Genes ATM, BLM, CEBPA, EPCAM, GATA2, HRAS, MLH1, MSH2, MSH6, NBN, NF1, PMS2, RUNX1, TERC, TERT, TP53 BRCA1, BRCA2, BRIP1, CHEK2,  PALB2  We discussed that only 5-10% of cancers are associated with a Hereditary cancer predisposition syndrome.  One of the most common hereditary cancer syndromes that increases breast cancer risk is called Hereditary Breast and Ovarian Cancer (HBOC) syndrome.  This syndrome is caused by mutations in the BRCA1 and BRCA2 genes.  This syndrome increases an individual's lifetime risk to develop breast, ovarian, pancreatic, and other types of cancer.  There are also many other cancer predisposition syndromes caused by mutations in several other genes.  We discussed that if she is found to have a mutation in one of these genes, it may impact future medical management recommendations such as increased cancer screenings and consideration of risk reducing surgeries.  A positive result could also have implications for the patient's family members.  A Negative result would mean we were unable to identify a hereditary component to her cancer, but does not rule out the possibility of a hereditary basis for her  cancer.  There could be mutations that are undetectable by current technology, or in genes not yet tested or identified to increase cancer risk.  In her case, we would still be very suspicious of a hereditary cancer syndrome in her family even if her results are negative.   We discussed the potential to find a Variant of Uncertain Significance or VUS.  These are variants that have not yet been identified as pathogenic or benign, and it is unknown if this variant is associated with increased cancer risk or if this is a normal finding.  Most VUS's are reclassified to benign or likely benign.   It should not be used to make medical management decisions. With time, we suspect the lab will determine the significance of any VUS's identified if any.   Based on Ms. Laperle's personal and family history of cancer, she meets medical criteria for genetic testing. Despite that she meets criteria, she may still have an out  of pocket cost. We discussed that if her  out of pocket cost for testing is over $100, the laboratory will call and confirm whether she wants to proceed with testing.  If the out of pocket cost of testing is less than $100 she will be billed by the genetic testing laboratory.   PLAN: After considering the risks, benefits, and limitations, Deanna Reynolds  provided informed consent to pursue genetic testing and the blood sample was sent to [facility] for analysis of the Common Hereditary Cancer Panel + Myelodysplastic/Leukemia Panel. Results should be available within approximately 2-3 weeks' time, at which point they will be disclosed by telephone to Deanna Reynolds, as will any additional recommendations warranted by these results. Deanna Reynolds will receive a summary of her genetic counseling visit and a copy of her results once available. This information will also be available in Epic. We encouraged Deanna Reynolds to remain in contact with cancer genetics annually so that we can continuously update the family history and inform her of any changes in cancer genetics and testing that may be of benefit for her family. Deanna Reynolds questions were answered to her satisfaction today. Our contact information was provided should additional questions or concerns arise.  Based on Ms. Bircher's family history, we recommended all of her relatives have genetic counseling and testing. Ms. Conerly will let us know if we can be of any assistance in coordinating genetic counseling and/or testing for this family member.   Lastly, we encouraged Ms. Meador to remain in contact with cancer genetics annually so that we can continuously update the family history and inform her of any changes in cancer genetics and testing that may be of benefit for this family.   Ms.  Morrisette questions were answered to her satisfaction today. Our contact information was provided should additional questions or concerns arise. Thank you for the referral and  allowing [place] to share in the care of your patient.   Tana Felts, MS Genetic Counselor [email or web address] phone: [phone]  The patient was seen for a total of 40 minutes in face-to-face genetic counseling.

## 2017-06-21 ENCOUNTER — Telehealth: Payer: Self-pay | Admitting: *Deleted

## 2017-06-21 ENCOUNTER — Other Ambulatory Visit (HOSPITAL_BASED_OUTPATIENT_CLINIC_OR_DEPARTMENT_OTHER): Payer: 59

## 2017-06-21 ENCOUNTER — Other Ambulatory Visit: Payer: Self-pay

## 2017-06-21 ENCOUNTER — Other Ambulatory Visit: Payer: 59

## 2017-06-21 ENCOUNTER — Ambulatory Visit (HOSPITAL_BASED_OUTPATIENT_CLINIC_OR_DEPARTMENT_OTHER): Payer: 59

## 2017-06-21 VITALS — BP 152/72 | HR 77 | Temp 98.0°F | Resp 18 | Wt 115.5 lb

## 2017-06-21 DIAGNOSIS — Z5112 Encounter for antineoplastic immunotherapy: Secondary | ICD-10-CM | POA: Diagnosis not present

## 2017-06-21 DIAGNOSIS — Z5189 Encounter for other specified aftercare: Secondary | ICD-10-CM

## 2017-06-21 DIAGNOSIS — C50511 Malignant neoplasm of lower-outer quadrant of right female breast: Secondary | ICD-10-CM | POA: Diagnosis not present

## 2017-06-21 DIAGNOSIS — Z170 Estrogen receptor positive status [ER+]: Principal | ICD-10-CM

## 2017-06-21 LAB — CBC WITH DIFFERENTIAL (CANCER CENTER ONLY)
BASO#: 0 10*3/uL (ref 0.0–0.2)
BASO%: 0 % (ref 0.0–2.0)
EOS%: 0 % (ref 0.0–7.0)
Eosinophils Absolute: 0 10*3/uL (ref 0.0–0.5)
HCT: 33.6 % — ABNORMAL LOW (ref 34.8–46.6)
HGB: 11.6 g/dL (ref 11.6–15.9)
LYMPH#: 1.1 10*3/uL (ref 0.9–3.3)
LYMPH%: 16.6 % (ref 14.0–48.0)
MCH: 30.1 pg (ref 26.0–34.0)
MCHC: 34.5 g/dL (ref 32.0–36.0)
MCV: 87 fL (ref 81–101)
MONO#: 0.3 10*3/uL (ref 0.1–0.9)
MONO%: 3.9 % (ref 0.0–13.0)
NEUT#: 5 10*3/uL (ref 1.5–6.5)
NEUT%: 79.5 % (ref 39.6–80.0)
PLATELETS: 218 10*3/uL (ref 145–400)
RBC: 3.85 10*6/uL (ref 3.70–5.32)
RDW: 12.3 % (ref 11.1–15.7)
WBC: 6.3 10*3/uL (ref 3.9–10.0)

## 2017-06-21 LAB — CMP (CANCER CENTER ONLY)
ALK PHOS: 58 U/L (ref 26–84)
ALT: 14 U/L (ref 10–47)
AST: 18 U/L (ref 11–38)
Albumin: 4.1 g/dL (ref 3.3–5.5)
BILIRUBIN TOTAL: 0.4 mg/dL (ref 0.20–1.60)
BUN: 15 mg/dL (ref 7–22)
CO2: 26 meq/L (ref 18–33)
Calcium: 10 mg/dL (ref 8.0–10.3)
Chloride: 101 mEq/L (ref 98–108)
Creat: 1.1 mg/dl (ref 0.6–1.2)
GLUCOSE: 131 mg/dL — AB (ref 73–118)
Potassium: 2.9 mEq/L — CL (ref 3.3–4.7)
Sodium: 142 mEq/L (ref 128–145)
Total Protein: 8.4 g/dL — ABNORMAL HIGH (ref 6.4–8.1)

## 2017-06-21 MED ORDER — ACETAMINOPHEN 325 MG PO TABS
ORAL_TABLET | ORAL | Status: AC
  Filled 2017-06-21: qty 2

## 2017-06-21 MED ORDER — LORAZEPAM 0.5 MG PO TABS: 0.5000 mg | ORAL_TABLET | Freq: Four times a day (QID) | ORAL | 0 refills | Status: DC | PRN

## 2017-06-21 MED ORDER — PEGFILGRASTIM 6 MG/0.6ML ~~LOC~~ PSKT
PREFILLED_SYRINGE | SUBCUTANEOUS | Status: AC
  Filled 2017-06-21: qty 0.6

## 2017-06-21 MED ORDER — ONDANSETRON HCL 8 MG PO TABS: 8.0000 mg | ORAL_TABLET | Freq: Two times a day (BID) | ORAL | 1 refills | Status: DC | PRN

## 2017-06-21 MED ORDER — DEXAMETHASONE SODIUM PHOSPHATE 10 MG/ML IJ SOLN
INTRAMUSCULAR | Status: AC
  Filled 2017-06-21: qty 1

## 2017-06-21 MED ORDER — SODIUM CHLORIDE 0.9 % IV SOLN
Freq: Once | INTRAVENOUS | Status: AC
  Administered 2017-06-21: 09:00:00 via INTRAVENOUS

## 2017-06-21 MED ORDER — SODIUM CHLORIDE 0.9 % IV SOLN
840.0000 mg | Freq: Once | INTRAVENOUS | Status: AC
  Administered 2017-06-21: 840 mg via INTRAVENOUS
  Filled 2017-06-21: qty 28

## 2017-06-21 MED ORDER — DEXAMETHASONE SODIUM PHOSPHATE 10 MG/ML IJ SOLN
10.0000 mg | Freq: Once | INTRAMUSCULAR | Status: AC
  Administered 2017-06-21: 10 mg via INTRAVENOUS

## 2017-06-21 MED ORDER — PEGFILGRASTIM 6 MG/0.6ML ~~LOC~~ PSKT
6.0000 mg | PREFILLED_SYRINGE | Freq: Once | SUBCUTANEOUS | Status: AC
  Administered 2017-06-21: 6 mg via SUBCUTANEOUS

## 2017-06-21 MED ORDER — POTASSIUM CHLORIDE CRYS ER 20 MEQ PO TBCR
40.0000 meq | EXTENDED_RELEASE_TABLET | Freq: Four times a day (QID) | ORAL | Status: AC
  Administered 2017-06-21 (×2): 40 meq via ORAL
  Filled 2017-06-21: qty 2

## 2017-06-21 MED ORDER — SODIUM CHLORIDE 0.9 % IV SOLN
540.0000 mg/m2 | Freq: Once | INTRAVENOUS | Status: AC
  Administered 2017-06-21: 820 mg via INTRAVENOUS
  Filled 2017-06-21: qty 41

## 2017-06-21 MED ORDER — DIPHENHYDRAMINE HCL 25 MG PO CAPS
ORAL_CAPSULE | ORAL | Status: AC
  Filled 2017-06-21: qty 2

## 2017-06-21 MED ORDER — PALONOSETRON HCL INJECTION 0.25 MG/5ML
INTRAVENOUS | Status: AC
  Filled 2017-06-21: qty 5

## 2017-06-21 MED ORDER — DOCETAXEL CHEMO INJECTION 160 MG/16ML
67.5000 mg/m2 | Freq: Once | INTRAVENOUS | Status: AC
  Administered 2017-06-21: 100 mg via INTRAVENOUS
  Filled 2017-06-21: qty 10

## 2017-06-21 MED ORDER — TRASTUZUMAB CHEMO 150 MG IV SOLR
8.0000 mg/kg | Freq: Once | INTRAVENOUS | Status: AC
  Administered 2017-06-21: 420 mg via INTRAVENOUS
  Filled 2017-06-21: qty 20

## 2017-06-21 MED ORDER — ACETAMINOPHEN 325 MG PO TABS
650.0000 mg | ORAL_TABLET | Freq: Once | ORAL | Status: AC
  Administered 2017-06-21: 650 mg via ORAL

## 2017-06-21 MED ORDER — SODIUM CHLORIDE 0.9% FLUSH
10.0000 mL | INTRAVENOUS | Status: DC | PRN
  Administered 2017-06-21: 10 mL
  Filled 2017-06-21: qty 10

## 2017-06-21 MED ORDER — PROCHLORPERAZINE MALEATE 10 MG PO TABS: 10.0000 mg | ORAL_TABLET | Freq: Four times a day (QID) | ORAL | 1 refills | Status: DC | PRN

## 2017-06-21 MED ORDER — HEPARIN SOD (PORK) LOCK FLUSH 100 UNIT/ML IV SOLN
500.0000 [IU] | Freq: Once | INTRAVENOUS | Status: AC | PRN
  Administered 2017-06-21: 500 [IU]
  Filled 2017-06-21: qty 5

## 2017-06-21 MED ORDER — PALONOSETRON HCL INJECTION 0.25 MG/5ML
0.2500 mg | Freq: Once | INTRAVENOUS | Status: AC
  Administered 2017-06-21: 0.25 mg via INTRAVENOUS

## 2017-06-21 MED ORDER — DIPHENHYDRAMINE HCL 25 MG PO CAPS
50.0000 mg | ORAL_CAPSULE | Freq: Once | ORAL | Status: AC
  Administered 2017-06-21: 50 mg via ORAL

## 2017-06-21 NOTE — Patient Instructions (Addendum)
[facility] Discharge Instructions for Patients Receiving Chemotherapy  Today you received the following chemotherapy agents"  Herceptin, Perjeta, Cytoxan, and Docetaxel.  To help prevent nausea and vomiting after your treatment, we encourage you to take your nausea medications as directed, they have been called into your pharmacy.   If you develop nausea and vomiting that is not controlled by your nausea medication, call the clinic.   BELOW ARE SYMPTOMS THAT SHOULD BE REPORTED IMMEDIATELY:  *FEVER GREATER THAN 100.5 F  *CHILLS WITH OR WITHOUT FEVER  NAUSEA AND VOMITING THAT IS NOT CONTROLLED WITH YOUR NAUSEA MEDICATION  *UNUSUAL SHORTNESS OF BREATH  *UNUSUAL BRUISING OR BLEEDING  TENDERNESS IN MOUTH AND THROAT WITH OR WITHOUT PRESENCE OF ULCERS  *URINARY PROBLEMS  *BOWEL PROBLEMS  UNUSUAL RASH Items with * indicate a potential emergency and should be followed up as soon as possible.  Feel free to call the clinic should you have any questions or concerns. The clinic phone number is [phone].  Please show the [facility] at check-in to the Emergency Department and triage nurse. Docetaxel injection What is this medicine? DOCETAXEL (doe se TAX el) is a chemotherapy drug. It targets fast dividing cells, like cancer cells, and causes these cells to die. This medicine is used to treat many types of cancers like breast cancer, certain stomach cancers, head and neck cancer, lung cancer, and prostate cancer. This medicine may be used for other purposes; ask your health care provider or pharmacist if you have questions. COMMON BRAND NAME(S): Docefrez, Taxotere What should I tell my health care provider before I take this medicine? They need to know if you have any of these conditions: -infection (especially a virus infection such as chickenpox, cold sores, or herpes) -liver disease -low blood counts, like low white cell, platelet, or red cell counts -an  unusual or allergic reaction to docetaxel, polysorbate 80, other chemotherapy agents, other medicines, foods, dyes, or preservatives -pregnant or trying to get pregnant -breast-feeding How should I use this medicine? This drug is given as an infusion into a vein. It is administered in a hospital or clinic by a specially trained health care professional. Talk to your pediatrician regarding the use of this medicine in children. Special care may be needed. Overdosage: If you think you have taken too much of this medicine contact a poison control center or emergency room at once. NOTE: This medicine is only for you. Do not share this medicine with others. What if I miss a dose? It is important not to miss your dose. Call your doctor or health care professional if you are unable to keep an appointment. What may interact with this medicine? -cyclosporine -erythromycin -ketoconazole -medicines to increase blood counts like filgrastim, pegfilgrastim, sargramostim -vaccines Talk to your doctor or health care professional before taking any of these medicines: -acetaminophen -aspirin -ibuprofen -ketoprofen -naproxen This list may not describe all possible interactions. Give your health care provider a list of all the medicines, herbs, non-prescription drugs, or dietary supplements you use. Also tell them if you smoke, drink alcohol, or use illegal drugs. Some items may interact with your medicine. What should I watch for while using this medicine? Your condition will be monitored carefully while you are receiving this medicine. You will need important blood work done while you are taking this medicine. This drug may make you feel generally unwell. This is not uncommon, as chemotherapy can affect healthy cells as well as cancer cells. Report any side effects.  Continue your course of treatment even though you feel ill unless your doctor tells you to stop. In some cases, you may be given additional  medicines to help with side effects. Follow all directions for their use. Call your doctor or health care professional for advice if you get a fever, chills or sore throat, or other symptoms of a cold or flu. Do not treat yourself. This drug decreases your body's ability to fight infections. Try to avoid being around people who are sick. This medicine may increase your risk to bruise or bleed. Call your doctor or health care professional if you notice any unusual bleeding. This medicine may contain alcohol in the product. You may get drowsy or dizzy. Do not drive, use machinery, or do anything that needs mental alertness until you know how this medicine affects you. Do not stand or sit up quickly, especially if you are an older patient. This reduces the risk of dizzy or fainting spells. Avoid alcoholic drinks. Do not become pregnant while taking this medicine. Women should inform their doctor if they wish to become pregnant or think they might be pregnant. There is a potential for serious side effects to an unborn child. Talk to your health care professional or pharmacist for more information. Do not breast-feed an infant while taking this medicine. What side effects may I notice from receiving this medicine? Side effects that you should report to your doctor or health care professional as soon as possible: -allergic reactions like skin rash, itching or hives, swelling of the face, lips, or tongue -low blood counts - This drug may decrease the number of white blood cells, red blood cells and platelets. You may be at increased risk for infections and bleeding. -signs of infection - fever or chills, cough, sore throat, pain or difficulty passing urine -signs of decreased platelets or bleeding - bruising, pinpoint red spots on the skin, black, tarry stools, nosebleeds -signs of decreased red blood cells - unusually weak or tired, fainting spells, lightheadedness -breathing problems -fast or irregular  heartbeat -low blood pressure -mouth sores -nausea and vomiting -pain, swelling, redness or irritation at the injection site -pain, tingling, numbness in the hands or feet -swelling of the ankle, feet, hands -weight gain Side effects that usually do not require medical attention (report to your doctor or health care professional if they continue or are bothersome): -bone pain -complete hair loss including hair on your head, underarms, pubic hair, eyebrows, and eyelashes -diarrhea -excessive tearing -changes in the color of fingernails -loosening of the fingernails -nausea -muscle pain -red flush to skin -sweating -weak or tired This list may not describe all possible side effects. Call your doctor for medical advice about side effects. You may report side effects to FDA at 1-800-FDA-1088. Where should I keep my medicine? This drug is given in a hospital or clinic and will not be stored at home. NOTE: This sheet is a summary. It may not cover all possible information. If you have questions about this medicine, talk to your doctor, pharmacist, or health care provider.  2018 Elsevier/Gold Standard (2015-08-27 12:32:56) Cyclophosphamide injection What is this medicine? CYCLOPHOSPHAMIDE (sye kloe FOSS fa mide) is a chemotherapy drug. It slows the growth of cancer cells. This medicine is used to treat many types of cancer like lymphoma, myeloma, leukemia, breast cancer, and ovarian cancer, to name a few. This medicine may be used for other purposes; ask your health care provider or pharmacist if you have questions. COMMON BRAND NAME(S): Cytoxan,  Neosar What should I tell my health care provider before I take this medicine? They need to know if you have any of these conditions: -blood disorders -history of other chemotherapy -infection -kidney disease -liver disease -recent or ongoing radiation therapy -tumors in the bone marrow -an unusual or allergic reaction to cyclophosphamide,  other chemotherapy, other medicines, foods, dyes, or preservatives -pregnant or trying to get pregnant -breast-feeding How should I use this medicine? This drug is usually given as an injection into a vein or muscle or by infusion into a vein. It is administered in a hospital or clinic by a specially trained health care professional. Talk to your pediatrician regarding the use of this medicine in children. Special care may be needed. Overdosage: If you think you have taken too much of this medicine contact a poison control center or emergency room at once. NOTE: This medicine is only for you. Do not share this medicine with others. What if I miss a dose? It is important not to miss your dose. Call your doctor or health care professional if you are unable to keep an appointment. What may interact with this medicine? This medicine may interact with the following medications: -amiodarone -amphotericin B -azathioprine -certain antiviral medicines for HIV or AIDS such as protease inhibitors (e.g., indinavir, ritonavir) and zidovudine -certain blood pressure medications such as benazepril, captopril, enalapril, fosinopril, lisinopril, moexipril, monopril, perindopril, quinapril, ramipril, trandolapril -certain cancer medications such as anthracyclines (e.g., daunorubicin, doxorubicin), busulfan, cytarabine, paclitaxel, pentostatin, tamoxifen, trastuzumab -certain diuretics such as chlorothiazide, chlorthalidone, hydrochlorothiazide, indapamide, metolazone -certain medicines that treat or prevent blood clots like warfarin -certain muscle relaxants such as succinylcholine -cyclosporine -etanercept -indomethacin -medicines to increase blood counts like filgrastim, pegfilgrastim, sargramostim -medicines used as general anesthesia -metronidazole -natalizumab This list may not describe all possible interactions. Give your health care provider a list of all the medicines, herbs, non-prescription  drugs, or dietary supplements you use. Also tell them if you smoke, drink alcohol, or use illegal drugs. Some items may interact with your medicine. What should I watch for while using this medicine? Visit your doctor for checks on your progress. This drug may make you feel generally unwell. This is not uncommon, as chemotherapy can affect healthy cells as well as cancer cells. Report any side effects. Continue your course of treatment even though you feel ill unless your doctor tells you to stop. Drink water or other fluids as directed. Urinate often, even at night. In some cases, you may be given additional medicines to help with side effects. Follow all directions for their use. Call your doctor or health care professional for advice if you get a fever, chills or sore throat, or other symptoms of a cold or flu. Do not treat yourself. This drug decreases your body's ability to fight infections. Try to avoid being around people who are sick. This medicine may increase your risk to bruise or bleed. Call your doctor or health care professional if you notice any unusual bleeding. Be careful brushing and flossing your teeth or using a toothpick because you may get an infection or bleed more easily. If you have any dental work done, tell your dentist you are receiving this medicine. You may get drowsy or dizzy. Do not drive, use machinery, or do anything that needs mental alertness until you know how this medicine affects you. Do not become pregnant while taking this medicine or for 1 year after stopping it. Women should inform their doctor if they wish to become pregnant  or think they might be pregnant. Men should not father a child while taking this medicine and for 4 months after stopping it. There is a potential for serious side effects to an unborn child. Talk to your health care professional or pharmacist for more information. Do not breast-feed an infant while taking this medicine. This medicine may  interfere with the ability to have a child. This medicine has caused ovarian failure in some women. This medicine has caused reduced sperm counts in some men. You should talk with your doctor or health care professional if you are concerned about your fertility. If you are going to have surgery, tell your doctor or health care professional that you have taken this medicine. What side effects may I notice from receiving this medicine? Side effects that you should report to your doctor or health care professional as soon as possible: -allergic reactions like skin rash, itching or hives, swelling of the face, lips, or tongue -low blood counts - this medicine may decrease the number of white blood cells, red blood cells and platelets. You may be at increased risk for infections and bleeding. -signs of infection - fever or chills, cough, sore throat, pain or difficulty passing urine -signs of decreased platelets or bleeding - bruising, pinpoint red spots on the skin, black, tarry stools, blood in the urine -signs of decreased red blood cells - unusually weak or tired, fainting spells, lightheadedness -breathing problems -dark urine -dizziness -palpitations -swelling of the ankles, feet, hands -trouble passing urine or change in the amount of urine -weight gain -yellowing of the eyes or skin Side effects that usually do not require medical attention (report to your doctor or health care professional if they continue or are bothersome): -changes in nail or skin color -hair loss -missed menstrual periods -mouth sores -nausea, vomiting This list may not describe all possible side effects. Call your doctor for medical advice about side effects. You may report side effects to FDA at 1-800-FDA-1088. Where should I keep my medicine? This drug is given in a hospital or clinic and will not be stored at home. NOTE: This sheet is a summary. It may not cover all possible information. If you have questions  about this medicine, talk to your doctor, pharmacist, or health care provider.  2018 Elsevier/Gold Standard (2012-06-08 16:22:58) Trastuzumab injection for infusion What is this medicine? TRASTUZUMAB (tras TOO zoo mab) is a monoclonal antibody. It is used to treat breast cancer and stomach cancer. This medicine may be used for other purposes; ask your health care provider or pharmacist if you have questions. COMMON BRAND NAME(S): Herceptin What should I tell my health care provider before I take this medicine? They need to know if you have any of these conditions: -heart disease -heart failure -lung or breathing disease, like asthma -an unusual or allergic reaction to trastuzumab, benzyl alcohol, or other medications, foods, dyes, or preservatives -pregnant or trying to get pregnant -breast-feeding How should I use this medicine? This drug is given as an infusion into a vein. It is administered in a hospital or clinic by a specially trained health care professional. Talk to your pediatrician regarding the use of this medicine in children. This medicine is not approved for use in children. Overdosage: If you think you have taken too much of this medicine contact a poison control center or emergency room at once. NOTE: This medicine is only for you. Do not share this medicine with others. What if I miss a dose? It is  important not to miss a dose. Call your doctor or health care professional if you are unable to keep an appointment. What may interact with this medicine? This medicine may interact with the following medications: -certain types of chemotherapy, such as daunorubicin, doxorubicin, epirubicin, and idarubicin This list may not describe all possible interactions. Give your health care provider a list of all the medicines, herbs, non-prescription drugs, or dietary supplements you use. Also tell them if you smoke, drink alcohol, or use illegal drugs. Some items may interact with your  medicine. What should I watch for while using this medicine? Visit your doctor for checks on your progress. Report any side effects. Continue your course of treatment even though you feel ill unless your doctor tells you to stop. Call your doctor or health care professional for advice if you get a fever, chills or sore throat, or other symptoms of a cold or flu. Do not treat yourself. Try to avoid being around people who are sick. You may experience fever, chills and shaking during your first infusion. These effects are usually mild and can be treated with other medicines. Report any side effects during the infusion to your health care professional. Fever and chills usually do not happen with later infusions. Do not become pregnant while taking this medicine or for 7 months after stopping it. Women should inform their doctor if they wish to become pregnant or think they might be pregnant. Women of child-bearing potential will need to have a negative pregnancy test before starting this medicine. There is a potential for serious side effects to an unborn child. Talk to your health care professional or pharmacist for more information. Do not breast-feed an infant while taking this medicine or for 7 months after stopping it. Women must use effective birth control with this medicine. What side effects may I notice from receiving this medicine? Side effects that you should report to your doctor or health care professional as soon as possible: -allergic reactions like skin rash, itching or hives, swelling of the face, lips, or tongue -chest pain or palpitations -cough -dizziness -feeling faint or lightheaded, falls -fever -general ill feeling or flu-like symptoms -signs of worsening heart failure like breathing problems; swelling in your legs and feet -unusually weak or tired Side effects that usually do not require medical attention (report to your doctor or health care professional if they continue or  are bothersome): -bone pain -changes in taste -diarrhea -joint pain -nausea/vomiting -weight loss This list may not describe all possible side effects. Call your doctor for medical advice about side effects. You may report side effects to FDA at 1-800-FDA-1088. Where should I keep my medicine? This drug is given in a hospital or clinic and will not be stored at home. NOTE: This sheet is a summary. It may not cover all possible information. If you have questions about this medicine, talk to your doctor, pharmacist, or health care provider.  2018 Elsevier/Gold Standard (2016-07-19 14:37:52)  Pertuzumab injection What is this medicine? PERTUZUMAB (per TOOZ ue mab) is a monoclonal antibody. It is used to treat breast cancer. This medicine may be used for other purposes; ask your health care provider or pharmacist if you have questions. COMMON BRAND NAME(S): PERJETA What should I tell my health care provider before I take this medicine? They need to know if you have any of these conditions: -heart disease -heart failure -high blood pressure -history of irregular heart beat -recent or ongoing radiation therapy -an unusual or allergic reaction  to pertuzumab, other medicines, foods, dyes, or preservatives -pregnant or trying to get pregnant -breast-feeding How should I use this medicine? This medicine is for infusion into a vein. It is given by a health care professional in a hospital or clinic setting. Talk to your pediatrician regarding the use of this medicine in children. Special care may be needed. Overdosage: If you think you have taken too much of this medicine contact a poison control center or emergency room at once. NOTE: This medicine is only for you. Do not share this medicine with others. What if I miss a dose? It is important not to miss your dose. Call your doctor or health care professional if you are unable to keep an appointment. What may interact with this  medicine? Interactions are not expected. Give your health care provider a list of all the medicines, herbs, non-prescription drugs, or dietary supplements you use. Also tell them if you smoke, drink alcohol, or use illegal drugs. Some items may interact with your medicine. This list may not describe all possible interactions. Give your health care provider a list of all the medicines, herbs, non-prescription drugs, or dietary supplements you use. Also tell them if you smoke, drink alcohol, or use illegal drugs. Some items may interact with your medicine. What should I watch for while using this medicine? Your condition will be monitored carefully while you are receiving this medicine. Report any side effects. Continue your course of treatment even though you feel ill unless your doctor tells you to stop. Do not become pregnant while taking this medicine or for 7 months after stopping it. Women should inform their doctor if they wish to become pregnant or think they might be pregnant. Women of child-bearing potential will need to have a negative pregnancy test before starting this medicine. There is a potential for serious side effects to an unborn child. Talk to your health care professional or pharmacist for more information. Do not breast-feed an infant while taking this medicine or for 7 months after stopping it. Women must use effective birth control with this medicine. Call your doctor or health care professional for advice if you get a fever, chills or sore throat, or other symptoms of a cold or flu. Do not treat yourself. Try to avoid being around people who are sick. You may experience fever, chills, and headache during the infusion. Report any side effects during the infusion to your health care professional. What side effects may I notice from receiving this medicine? Side effects that you should report to your doctor or health care professional as soon as possible: -breathing problems -chest  pain or palpitations -dizziness -feeling faint or lightheaded -fever or chills -skin rash, itching or hives -sore throat -swelling of the face, lips, or tongue -swelling of the legs or ankles -unusually weak or tired Side effects that usually do not require medical attention (report to your doctor or health care professional if they continue or are bothersome): -diarrhea -hair loss -nausea, vomiting -tiredness This list may not describe all possible side effects. Call your doctor for medical advice about side effects. You may report side effects to FDA at 1-800-FDA-1088. Where should I keep my medicine? This drug is given in a hospital or clinic and will not be stored at home. NOTE: This sheet is a summary. It may not cover all possible information. If you have questions about this medicine, talk to your doctor, pharmacist, or health care provider.  2018 Elsevier/Gold Standard (2015-08-27 12:08:50)  Pegfilgrastim injection What is this medicine? PEGFILGRASTIM (PEG fil gra stim) is a long-acting granulocyte colony-stimulating factor that stimulates the growth of neutrophils, a type of white blood cell important in the body's fight against infection. It is used to reduce the incidence of fever and infection in patients with certain types of cancer who are receiving chemotherapy that affects the bone marrow, and to increase survival after being exposed to high doses of radiation. This medicine may be used for other purposes; ask your health care provider or pharmacist if you have questions. COMMON BRAND NAME(S): Neulasta What should I tell my health care provider before I take this medicine? They need to know if you have any of these conditions: -kidney disease -latex allergy -ongoing radiation therapy -sickle cell disease -skin reactions to acrylic adhesives (On-Body Injector only) -an unusual or allergic reaction to pegfilgrastim, filgrastim, other medicines, foods, dyes, or  preservatives -pregnant or trying to get pregnant -breast-feeding How should I use this medicine? This medicine is for injection under the skin. If you get this medicine at home, you will be taught how to prepare and give the pre-filled syringe or how to use the On-body Injector. Refer to the patient Instructions for Use for detailed instructions. Use exactly as directed. Tell your healthcare provider immediately if you suspect that the On-body Injector may not have performed as intended or if you suspect the use of the On-body Injector resulted in a missed or partial dose. It is important that you put your used needles and syringes in a special sharps container. Do not put them in a trash can. If you do not have a sharps container, call your pharmacist or healthcare provider to get one. Talk to your pediatrician regarding the use of this medicine in children. While this drug may be prescribed for selected conditions, precautions do apply. Overdosage: If you think you have taken too much of this medicine contact a poison control center or emergency room at once. NOTE: This medicine is only for you. Do not share this medicine with others. What if I miss a dose? It is important not to miss your dose. Call your doctor or health care professional if you miss your dose. If you miss a dose due to an On-body Injector failure or leakage, a new dose should be administered as soon as possible using a single prefilled syringe for manual use. What may interact with this medicine? Interactions have not been studied. Give your health care provider a list of all the medicines, herbs, non-prescription drugs, or dietary supplements you use. Also tell them if you smoke, drink alcohol, or use illegal drugs. Some items may interact with your medicine. This list may not describe all possible interactions. Give your health care provider a list of all the medicines, herbs, non-prescription drugs, or dietary supplements you  use. Also tell them if you smoke, drink alcohol, or use illegal drugs. Some items may interact with your medicine. What should I watch for while using this medicine? You may need blood work done while you are taking this medicine. If you are going to need a MRI, CT scan, or other procedure, tell your doctor that you are using this medicine (On-Body Injector only). What side effects may I notice from receiving this medicine? Side effects that you should report to your doctor or health care professional as soon as possible: -allergic reactions like skin rash, itching or hives, swelling of the face, lips, or tongue -dizziness -fever -pain, redness, or irritation at  site where injected -pinpoint red spots on the skin -red or dark-brown urine -shortness of breath or breathing problems -stomach or side pain, or pain at the shoulder -swelling -tiredness -trouble passing urine or change in the amount of urine Side effects that usually do not require medical attention (report to your doctor or health care professional if they continue or are bothersome): -bone pain -muscle pain This list may not describe all possible side effects. Call your doctor for medical advice about side effects. You may report side effects to FDA at 1-800-FDA-1088. Where should I keep my medicine? Keep out of the reach of children. Store pre-filled syringes in a refrigerator between 2 and 8 degrees C (36 and 46 degrees F). Do not freeze. Keep in carton to protect from light. Throw away this medicine if it is left out of the refrigerator for more than 48 hours. Throw away any unused medicine after the expiration date. NOTE: This sheet is a summary. It may not cover all possible information. If you have questions about this medicine, talk to your doctor, pharmacist, or health care provider.  2018 Elsevier/Gold Standard (2016-07-21 12:58:03)

## 2017-06-21 NOTE — Telephone Encounter (Signed)
Critical Value Potassium 2.9 Dr Ennever notified. Orders will be placed 

## 2017-06-22 ENCOUNTER — Other Ambulatory Visit: Payer: Self-pay | Admitting: Hematology & Oncology

## 2017-06-22 ENCOUNTER — Ambulatory Visit (HOSPITAL_COMMUNITY)
Admission: RE | Admit: 2017-06-22 | Discharge: 2017-06-22 | Disposition: A | Discharge status: Home / Self Care | Payer: 59 | Source: Ambulatory Visit | Attending: Hematology & Oncology | Admitting: Hematology & Oncology

## 2017-06-22 ENCOUNTER — Encounter: Payer: Self-pay | Admitting: Nutrition

## 2017-06-22 DIAGNOSIS — Z170 Estrogen receptor positive status [ER+]: Principal | ICD-10-CM

## 2017-06-22 DIAGNOSIS — C50511 Malignant neoplasm of lower-outer quadrant of right female breast: Secondary | ICD-10-CM | POA: Insufficient documentation

## 2017-06-22 DIAGNOSIS — I10 Essential (primary) hypertension: Secondary | ICD-10-CM | POA: Diagnosis not present

## 2017-06-22 DIAGNOSIS — I340 Nonrheumatic mitral (valve) insufficiency: Secondary | ICD-10-CM | POA: Diagnosis not present

## 2017-06-22 NOTE — Progress Notes (Signed)
Provided one complimentary case of Ensure Plus 

## 2017-06-22 NOTE — Progress Notes (Signed)
  Echocardiogram 2D Echocardiogram has been performed.  Tresa Res 06/22/2017, 12:37 PM

## 2017-06-23 ENCOUNTER — Encounter: Payer: Self-pay | Admitting: *Deleted

## 2017-06-23 ENCOUNTER — Ambulatory Visit: Payer: 59

## 2017-06-23 NOTE — Progress Notes (Signed)
Called patient to find out how she was feeling post chemotherapy.  Patient sounds good and feels very good.  PAtient thrilled with the care she received.  Informed patient to call us with any issues.

## 2017-06-26 ENCOUNTER — Telehealth: Payer: Self-pay

## 2017-06-26 NOTE — Telephone Encounter (Signed)
Pt notified by phone of Echo results. dph

## 2017-06-26 NOTE — Telephone Encounter (Signed)
-----   Message from Volanda Napoleon, MD sent at 06/26/2017  6:15 AM EST ----- Call - your heart is pumping like a champion!!!  pete

## 2017-06-27 ENCOUNTER — Encounter (HOSPITAL_BASED_OUTPATIENT_CLINIC_OR_DEPARTMENT_OTHER): Payer: Self-pay | Admitting: *Deleted

## 2017-06-27 ENCOUNTER — Other Ambulatory Visit: Payer: Self-pay

## 2017-06-27 ENCOUNTER — Emergency Department (HOSPITAL_BASED_OUTPATIENT_CLINIC_OR_DEPARTMENT_OTHER)
Admission: EM | Admit: 2017-06-27 | Discharge: 2017-06-27 | Disposition: A | Discharge status: Home / Self Care | Payer: 59 | Attending: Emergency Medicine | Admitting: Emergency Medicine

## 2017-06-27 ENCOUNTER — Emergency Department (HOSPITAL_BASED_OUTPATIENT_CLINIC_OR_DEPARTMENT_OTHER): Payer: 59

## 2017-06-27 ENCOUNTER — Telehealth: Payer: Self-pay | Admitting: *Deleted

## 2017-06-27 DIAGNOSIS — M5431 Sciatica, right side: Secondary | ICD-10-CM | POA: Insufficient documentation

## 2017-06-27 DIAGNOSIS — Z87891 Personal history of nicotine dependence: Secondary | ICD-10-CM | POA: Insufficient documentation

## 2017-06-27 DIAGNOSIS — R0602 Shortness of breath: Secondary | ICD-10-CM | POA: Diagnosis not present

## 2017-06-27 DIAGNOSIS — Z79899 Other long term (current) drug therapy: Secondary | ICD-10-CM | POA: Insufficient documentation

## 2017-06-27 DIAGNOSIS — M545 Low back pain: Secondary | ICD-10-CM | POA: Diagnosis present

## 2017-06-27 DIAGNOSIS — Z853 Personal history of malignant neoplasm of breast: Secondary | ICD-10-CM | POA: Insufficient documentation

## 2017-06-27 DIAGNOSIS — R3915 Urgency of urination: Secondary | ICD-10-CM | POA: Diagnosis not present

## 2017-06-27 DIAGNOSIS — R002 Palpitations: Secondary | ICD-10-CM | POA: Diagnosis not present

## 2017-06-27 DIAGNOSIS — I10 Essential (primary) hypertension: Secondary | ICD-10-CM | POA: Diagnosis not present

## 2017-06-27 LAB — COMPREHENSIVE METABOLIC PANEL
ALT: 17 U/L (ref 14–54)
ANION GAP: 8 (ref 5–15)
AST: 19 U/L (ref 15–41)
Albumin: 3.6 g/dL (ref 3.5–5.0)
Alkaline Phosphatase: 52 U/L (ref 38–126)
BILIRUBIN TOTAL: 0.3 mg/dL (ref 0.3–1.2)
BUN: 12 mg/dL (ref 6–20)
CO2: 27 mmol/L (ref 22–32)
Calcium: 8.9 mg/dL (ref 8.9–10.3)
Chloride: 99 mmol/L — ABNORMAL LOW (ref 101–111)
Creatinine, Ser: 0.67 mg/dL (ref 0.44–1.00)
Glucose, Bld: 88 mg/dL (ref 65–99)
POTASSIUM: 3.5 mmol/L (ref 3.5–5.1)
Sodium: 134 mmol/L — ABNORMAL LOW (ref 135–145)
TOTAL PROTEIN: 7.3 g/dL (ref 6.5–8.1)

## 2017-06-27 LAB — CBC WITH DIFFERENTIAL/PLATELET
BASOS ABS: 0 10*3/uL (ref 0.0–0.1)
Basophils Relative: 0 %
Eosinophils Absolute: 0 10*3/uL (ref 0.0–0.7)
Eosinophils Relative: 0 %
HEMATOCRIT: 32.4 % — AB (ref 36.0–46.0)
Hemoglobin: 10.7 g/dL — ABNORMAL LOW (ref 12.0–15.0)
LYMPHS ABS: 2.4 10*3/uL (ref 0.7–4.0)
LYMPHS PCT: 48 %
MCH: 28.9 pg (ref 26.0–34.0)
MCHC: 33 g/dL (ref 30.0–36.0)
MCV: 87.6 fL (ref 78.0–100.0)
MONOS PCT: 17 %
Monocytes Absolute: 0.9 10*3/uL (ref 0.1–1.0)
NEUTROS PCT: 35 %
Neutro Abs: 1.8 10*3/uL (ref 1.7–7.7)
Platelets: 160 10*3/uL (ref 150–400)
RBC: 3.7 MIL/uL — AB (ref 3.87–5.11)
RDW: 12.3 % (ref 11.5–15.5)
WBC: 5.1 10*3/uL (ref 4.0–10.5)

## 2017-06-27 LAB — URINALYSIS, ROUTINE W REFLEX MICROSCOPIC
Bilirubin Urine: NEGATIVE
Glucose, UA: NEGATIVE mg/dL
Hgb urine dipstick: NEGATIVE
KETONES UR: NEGATIVE mg/dL
NITRITE: NEGATIVE
PH: 7 (ref 5.0–8.0)
PROTEIN: NEGATIVE mg/dL
Specific Gravity, Urine: 1.01 (ref 1.005–1.030)

## 2017-06-27 LAB — URINALYSIS, MICROSCOPIC (REFLEX)

## 2017-06-27 MED ORDER — TRAMADOL HCL 50 MG PO TABS: 50.0000 mg | ORAL_TABLET | Freq: Four times a day (QID) | ORAL | 0 refills | Status: DC | PRN

## 2017-06-27 MED ORDER — HYDROCODONE-ACETAMINOPHEN 5-325 MG PO TABS
1.0000 | ORAL_TABLET | Freq: Once | ORAL | Status: AC
  Administered 2017-06-27: 1 via ORAL
  Filled 2017-06-27: qty 1

## 2017-06-27 MED FILL — traMADol HCL 50 MG TABS: 50 | 3 days supply | Qty: 15 | Fill #0

## 2017-06-27 NOTE — ED Triage Notes (Signed)
Back pain woke her at 4am. Pain is moving down her legs. Heart fluttering.

## 2017-06-27 NOTE — Telephone Encounter (Addendum)
Patient calling the office in severe distress. Very difficult to understand what the patient is saying. She is crying and her breathing sounds very short and gasping. Through her crying, she describes difficulty breathing and pounding in her heart.   Patient sent to the ED. Instructed patient to get to the emergency room ASAP. She needs to be worked up immediately regarding her symptoms. She understood and agreed.   Laverna Peace NP notified of situation.    ADDENDUM: 1110a  Received call back from patient. She is calm, speaking normally. She states she feels fine now and the pain has passed. She was up since 430a with pain to her back, which she now feels is atypical gas pain. She feels like she had a panic attack related to this pain. She states her breathing is normal - patient's breathing now sounds normal without SOB when speaking -  She no longer has pain in her back and she denies any heart pounding.  Reviewed with Laverna Peace NP. She is okay with patient staying home and observing for further symptoms. Patient has been prescribed lorazepam for nausea/vomting. Educated patient that this medication can also be used for anxiety and that if patient felt like she was getting anxious again she could try taking medication for symptoms relief.  If another episode happens, patient instructed to go to the ED. Patient agrees.

## 2017-06-27 NOTE — ED Provider Notes (Signed)
[facility] EMERGENCY DEPARTMENT Provider Note   CSN: [id number] Arrival date & time: 06/27/17  1310     History   Chief Complaint Chief Complaint  Patient presents with  . Back Pain    HPI Deanna Reynolds is a 66 y.o. female.  HPI Presents with low back pain radiating to both legs.  States it woke her up at 4 AM this morning.  No weakness or numbness.  Denies any trauma.  Denies history of low back pain.  Denies hematuria, frequency or dysuria.  She does admit to urinary urgency.  States she believes she had a anxiety attack and developed palpitations and shortness of breath.  This is since resolved. Past Medical History:  Diagnosis Date  . Cancer [facility])    breast cancer of right sided  . DDD (degenerative disc disease), cervical   . Depression   . Family history of breast cancer   . Family history of leukemia   . Family history of ovarian cancer   . Family history of stomach cancer   . Gastritis   . Hypertension   . 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 Family history of breast cancer   . Family history of leukemia   . Family history of stomach cancer   . Family history of ovarian cancer   . 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 GERD (gastroesophageal reflux disease) 10/03/2015    Past Surgical History:  Procedure Laterality Date  . AXILLARY SENTINEL NODE BIOPSY Right 05/16/2017   Procedure: RIGHT AXILLARY SENTINEL LYMPH NODE BIOPSY ERAS PATHWAY;  Surgeon: Donnie Mesa, MD;  Location: [place];  Service: General;  Laterality: Right;  PEC BLOCK  . BREAST LUMPECTOMY WITH RADIOACTIVE SEED LOCALIZATION Right 04/26/2017   Procedure: RIGHT BREAST LUMPECTOMY WITH RADIOACTIVE SEED LOCALIZATION ERAS PATHWAY;  Surgeon: Donnie Mesa, MD;  Location: [place];  Service: General;  Laterality:  Right;  . BREAST SURGERY Bilateral    multiple breast biopies  . PORTACATH PLACEMENT Right 05/16/2017   Procedure: INSERTION PORT-A-CATH WITH ULTRASOUND GUIDANCE;  Surgeon: Donnie Mesa, MD;  Location: [place];  Service: General;  Laterality: Right;  . TUBAL LIGATION      OB History    No data available       Home Medications    Prior to Admission medications   Medication Sig Start Date End Date Taking? Authorizing Provider  Ascorbic Acid (VITAMIN C) 1000 MG tablet Take 1,000 mg by mouth daily.    [provider]  Cholecalciferol (VITAMIN D-3) 5000 units TABS Take 1 tablet by mouth daily.    [provider]  Cyanocobalamin (VITAMIN B-12 PO) Take 1 tablet by mouth daily.    [provider]  dexamethasone (DECADRON) 4 MG tablet Take 2 tablets TWICE a day for 4 days - start 1 day BEFORE each cycle of chemotherapy 06/13/17   Volanda Napoleon, MD  Flaxseed, Linseed, (FLAX SEED OIL PO) Take by mouth. One teaspoon daily    [provider]  hydrochlorothiazide (HYDRODIURIL) 25 MG tablet 25 mg. 08/19/16   [provider]  HYDROcodone-acetaminophen (NORCO/VICODIN) 5-325 MG tablet Take 1-2 tablets by mouth every 6 (six) hours as needed for moderate pain. Patient not taking: Reported on 05/30/2017 04/26/17   Donnie Mesa, MD  HYDROcodone-acetaminophen (NORCO/VICODIN) 5-325 MG tablet Take  1 tablet by mouth every 4 (four) hours as needed for moderate pain. Patient not taking: Reported on 06/12/2017 05/16/17   Donnie Mesa, MD  lidocaine-prilocaine (EMLA) cream Apply 1 application topically as needed. 06/08/17   Volanda Napoleon, MD  LORazepam (ATIVAN) 0.5 MG tablet Take 1 tablet (0.5 mg total) every 6 (six) hours as needed by mouth (Nausea or vomiting). 06/21/17   Volanda Napoleon, MD  losartan (COZAAR) 100 MG tablet  08/19/16   [provider]  MAGNESIUM PO Take 1 tablet by mouth daily.    [provider]  ondansetron  (ZOFRAN) 8 MG tablet Take 1 tablet (8 mg total) 2 (two) times daily as needed by mouth for refractory nausea / vomiting. Start on day 3 after chemo. 06/21/17   Volanda Napoleon, MD  prochlorperazine (COMPAZINE) 10 MG tablet Take 1 tablet (10 mg total) every 6 (six) hours as needed by mouth (Nausea or vomiting). 06/21/17   Volanda Napoleon, MD  traMADol (ULTRAM) 50 MG tablet Take 1 tablet (50 mg total) by mouth every 6 (six) hours as needed for severe pain. 06/27/17   Julianne Rice, MD  VITAMIN E PO Take 1 tablet by mouth daily.    [provider]  zolpidem (AMBIEN CR) 6.25 MG CR tablet Take 1 tablet (6.25 mg total) by mouth at bedtime as needed for sleep. Patient not taking: Reported on 05/30/2017 05/16/17 05/16/18  Donnie Mesa, MD    Family History Family History  Problem Relation Age of Onset  . COPD Mother   . Breast cancer Sister 19  . Ovarian cancer Maternal Grandmother 54  . Cancer Cousin 45       type unk  . Leukemia Father 44  . Stomach cancer Brother 77  . Breast cancer Paternal Aunt 55  . Cancer Maternal Grandfather        type unk, was quick (died in 1 month after dx)  . Breast cancer Paternal Grandmother 78  . Stomach cancer Sister 40  . Breast cancer Paternal Aunt        dx <50  . Breast cancer Paternal Aunt        dx <50  . Breast cancer Paternal Aunt        dx <50  . Breast cancer Other   . Cancer Other 27       metastatic- site of origin unk  . Colon cancer Neg Hx     Social History Social History   Tobacco Use  . Smoking status: Former Smoker    Packs/day: 0.25    Years: 5.00    Pack years: 1.25    Types: Cigarettes    Last attempt to quit: 08/21/2014    Years since quitting: 2.8  . Smokeless tobacco: Never Used  Substance Use Topics  . Alcohol use: No  . Drug use: No     Allergies   Motrin [ibuprofen]   Review of Systems Review of Systems  Constitutional: Negative for chills and fever.  Respiratory: Positive for shortness of  breath.   Cardiovascular: Positive for palpitations. Negative for chest pain and leg swelling.  Gastrointestinal: Negative for abdominal pain, constipation, diarrhea and vomiting.  Genitourinary: Positive for urgency. Negative for dysuria, flank pain, frequency and pelvic pain.  Musculoskeletal: Positive for back pain and myalgias. Negative for joint swelling, neck pain and neck stiffness.  Skin: Negative for rash and wound.  Neurological: Negative for dizziness, weakness, light-headedness, numbness and headaches.  Psychiatric/Behavioral: The patient is  nervous/anxious.   All other systems reviewed and are negative.    Physical Exam Updated Vital Signs BP 119/71 (BP Location: Right Arm)   Pulse 84   Temp 98.3 F (36.8 C) (Oral)   Resp 15   Ht 5' 2.5" (1.588 m)   Wt 52.2 kg (115 lb)   SpO2 97%   BMI 20.70 kg/m   Physical Exam  Constitutional: She is oriented to person, place, and time. She appears well-developed and well-nourished. No distress.  HENT:  Head: Normocephalic and atraumatic.  Mouth/Throat: Oropharynx is clear and moist.  Eyes: EOM are normal. Pupils are equal, round, and reactive to light.  Neck: Normal range of motion. Neck supple.  Cardiovascular: Normal rate and regular rhythm. Exam reveals no gallop and no friction rub.  No murmur heard. Pulmonary/Chest: Effort normal and breath sounds normal. No stridor. No respiratory distress. She has no wheezes. She has no rales. She exhibits no tenderness.  Abdominal: Soft. Bowel sounds are normal. There is no tenderness. There is no rebound and no guarding.  Musculoskeletal: Normal range of motion. She exhibits no edema or tenderness.  No CVA tenderness bilaterally.  No midline thoracic or lumbar tenderness.  Negative straight leg raise bilaterally.  No lower extremity swelling, asymmetry or tenderness.  Distal pulses are 2+.  Neurological: She is alert and oriented to person, place, and time.  5/5 motor in all  extremities.  Sensation fully intact intact.  No saddle anesthesia.  Skin: Skin is warm and dry. No rash noted. She is not diaphoretic. No erythema.  Psychiatric: She has a normal mood and affect. Her behavior is normal.  Nursing note and vitals reviewed.    ED Treatments / Results  Labs (all labs ordered are listed, but only abnormal results are displayed) Labs Reviewed  COMPREHENSIVE METABOLIC PANEL - Abnormal; Notable for the following components:      Result Value   Sodium 134 (*)    Chloride 99 (*)    All other components within normal limits  CBC WITH DIFFERENTIAL/PLATELET - Abnormal; Notable for the following components:   RBC 3.70 (*)    Hemoglobin 10.7 (*)    HCT 32.4 (*)    All other components within normal limits  URINALYSIS, ROUTINE W REFLEX MICROSCOPIC - Abnormal; Notable for the following components:   Leukocytes, UA TRACE (*)    All other components within normal limits  URINALYSIS, MICROSCOPIC (REFLEX) - Abnormal; Notable for the following components:   Bacteria, UA RARE (*)    Squamous Epithelial / LPF 0-5 (*)    All other components within normal limits    EKG  EKG Interpretation  Date/Time:  Tuesday June 27 2017 13:20:19 EST Ventricular Rate:  94 PR Interval:  156 QRS Duration: 86 QT Interval:  360 QTC Calculation: 450 R Axis:   26 Text Interpretation:  Normal sinus rhythm Normal ECG Confirmed by Julianne Rice [id number]) on 06/27/2017 2:50:04 PM       Radiology Ct Lumbar Spine Wo Contrast  Result Date: 06/27/2017 CLINICAL DATA:  Back pain that awoke the patient at 4 a.m., with the down the legs. EXAM: CT LUMBAR SPINE WITHOUT CONTRAST TECHNIQUE: Multidetector CT imaging of the lumbar spine was performed without intravenous contrast administration. Multiplanar CT image reconstructions were also generated. COMPARISON:  02/22/2017 FINDINGS: Segmentation: 5 lumbar type vertebrae. Alignment: Normal. Vertebrae: No acute fracture or focal pathologic  process. Paraspinal and other soft tissues: No explanation for symptoms. Aortic and iliac atherosclerosis. No hydronephrosis or visible urolithiasis.  Disc levels: The disc height diffusely. Minor endplate and lower lumbar facet spurs. No evidence of impingement. IMPRESSION: No acute finding. No notable degenerative changes or evidence of impingement. Electronically Signed   By: Monte Fantasia M.D.   On: 06/27/2017 14:37    Procedures Procedures (including critical care time)  Medications Ordered in ED Medications  HYDROcodone-acetaminophen (NORCO/VICODIN) 5-325 MG per tablet 1 tablet (1 tablet Oral Given 06/27/17 1449)     Initial Impression / Assessment and Plan / ED Course  I have reviewed the triage vital signs and the nursing notes.  Pertinent labs & imaging results that were available during my care of the patient were reviewed by me and considered in my medical decision making (see chart for details).     Pain is improved.  CT without acute abnormality.  Continues to have normal neurologic exam.  Will treat symptomatically.  Advised to follow-up with her primary physician and return precautions given.  Final Clinical Impressions(s) / ED Diagnoses   Final diagnoses:  Sciatica of right side    ED Discharge Orders        Ordered    traMADol (ULTRAM) 50 MG tablet  Every 6 hours PRN     06/27/17 1515       Julianne Rice, MD 06/27/17 1521

## 2017-06-27 NOTE — ED Notes (Signed)
Pt on monitor 

## 2017-07-05 ENCOUNTER — Telehealth: Payer: Self-pay | Admitting: Genetics

## 2017-07-05 ENCOUNTER — Other Ambulatory Visit: Payer: 59

## 2017-07-05 ENCOUNTER — Ambulatory Visit: Payer: 59 | Admitting: Hematology & Oncology

## 2017-07-05 ENCOUNTER — Ambulatory Visit: Payer: 59

## 2017-07-05 NOTE — Telephone Encounter (Signed)
I revealed negative genetic test results.  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  We discussed that there is still a significant family history of cancer int he family that is very suspicious for a hereditary cancer syndrome.  The reason for her negative result could be that there is a hereditary mutation that Deanna Reynolds did not inherit and therefore was not detected in her testing.  It is also possible that there is a hereditary cause for her and her family's cancer that cannot be detected with current technology or is in a genet not yet identified to increase cancer risk.  We recommended that all of Deanna Reynolds's relatives have genetic testing, especially those affected with cancer.  It will be important for her to stay in touch with genetics to learn about new testing int he furture and to update [place] regarding the family history and other relatives' genetic testing.

## 2017-07-06 ENCOUNTER — Encounter: Payer: Self-pay | Admitting: Genetics

## 2017-07-06 ENCOUNTER — Ambulatory Visit: Payer: Self-pay | Admitting: Genetics

## 2017-07-06 DIAGNOSIS — Z800 Family history of malignant neoplasm of digestive organs: Secondary | ICD-10-CM

## 2017-07-06 DIAGNOSIS — C50511 Malignant neoplasm of lower-outer quadrant of right female breast: Secondary | ICD-10-CM

## 2017-07-06 DIAGNOSIS — Z806 Family history of leukemia: Secondary | ICD-10-CM

## 2017-07-06 DIAGNOSIS — Z170 Estrogen receptor positive status [ER+]: Principal | ICD-10-CM

## 2017-07-06 DIAGNOSIS — Z803 Family history of malignant neoplasm of breast: Secondary | ICD-10-CM

## 2017-07-06 DIAGNOSIS — Z809 Family history of malignant neoplasm, unspecified: Secondary | ICD-10-CM

## 2017-07-06 DIAGNOSIS — Z1379 Encounter for other screening for genetic and chromosomal anomalies: Secondary | ICD-10-CM

## 2017-07-06 DIAGNOSIS — Z8041 Family history of malignant neoplasm of ovary: Secondary | ICD-10-CM

## 2017-07-06 NOTE — Progress Notes (Signed)
HPI: Ms. Rundquist was previously seen in the [facility] clinic on 06/20/2017 due to a personal and family history of breast cancer and concerns regarding a hereditary predisposition to cancer. Please refer to our prior cancer genetics clinic note for more information regarding Ms. Sadiq's medical, social and family histories, and our assessment and recommendations, at the time. Ms. Maul recent genetic test results were disclosed to her, as well as recommendations warranted by these results. These results and recommendations are discussed in more detail below.  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  The Common Hereditary Cancers Syndrome + Myelodysplastic Syndrome/Leukemia Panel + Gastric Cancer Panel was ordered from [facility] (54 genes). The following genes were evaluated for sequence changes and exonic deletions/duplications: APC, ATM, AXIN2, BARD1, BLM, BMPR1A, BRCA1, BRCA2, BRIP1, CDH1, CDK4, CDKN2A (p14ARF), CDKN2A (p16INK4a), CEBPA, CHEK2, CTNNA1, DICER1, EPCAM*, GATA2, GREM1*, HRAS, KIT, MEN1, MLH1, MSH2, MSH3, MSH6, MUTYH, NBN, NF1, PALB2, PDGFRA, PMS2, POLD1, POLE, PTEN, RAD50, RAD51C, RAD51D, RUNX1, SDHB, SDHC, SDHD, SMAD4, SMARCA4, STK11, TERC, TERT, TP53, TSC1, TSC2, VHL. The following genes were evaluated for sequence changes only:HOXB13*, NTHL1*, SDHA  Results: No pathogenic variants identified.  2 Variants of Uncertain significance were identified in the genes MEN1 c.839T>C (p.Leu280Ser) and PMS2 c.1559C>T (p.Ala520Val).  The date of this test report is 06/27/2017.           FAMILY HISTORY:  We obtained a detailed, 4-generation family history.  Significant diagnoses are listed below: Family History  Problem Relation Age of Onset  . COPD Mother   . Breast cancer Sister 4  . Ovarian cancer Maternal Grandmother 50  . Cancer Cousin 45       type unk  . Leukemia Father 61  . Stomach cancer Brother 39  . Breast cancer Paternal Aunt 34  . Cancer Maternal Grandfather        type unk, was quick (died in 1 month after dx)  . Breast cancer Paternal Grandmother 45  . Stomach cancer Sister 69  . Breast cancer Paternal Aunt        dx <50  . Breast cancer Paternal Aunt        dx <50  . Breast cancer Paternal Aunt        dx <50  . Breast cancer Other   . Cancer Other 27       metastatic- site of origin unk  . Colon cancer Neg Hx    Ms. Austad has a 47 year-old daughter with no history of cancer.  This daughter has a 12 year-old son with no history of cancer.  Ms. Diosdado has 4 brothers and 5 sisters listed below: -1 brother died due to trauma.  He had a child, no information is known about this child.  -2 brothers are in their 46's with no history of cancer.  They both had children, no history of cancer.  -1 brother was diagnosed with stomach cancer in his late 74's and is now 42.  He has a son and a daughter.  -1 sister was diagnosed with breast cancer in her 12's and  is now in her 37's.  She has 1 son and 1 daughter.  -1 sister is currently going through treatment for stomach cancer diagnosed in her 59's, she is now 66.  This sister has 2 daughters and a son with no history of cancer.  One of these daughters had a daughter (pt. Niece's daughter) with something identified in her breast at age 44.  She is unsure if it is cancer.  -1 sister is in her 60's with no history of cancer an no children. She has thyroid issues.  -1 sister has a health condition but is 'in denial and will not be seen by a doctor for it.  She had a daughter who died of metastatic cancer ( unk  origin) at 68.   -1 sister is in her 20's with no history of cancer.  She has children, no history of cancer.   Ms. Clift father died in his 71's of leukemia.  He had 5 maternal half- sisters and several maternal half- brothers (no information known about the brothers). -1 paternal half-aunt is 53 and had a bilateral mastectomy to treat breast cancer dx. In her late 67's.  She had a daughter who had cancer (type unk) in her 10's.  This daughter had a son who is currently having treatment for an unknown type of cancer in his 59's.   -3 other paternal half aunts were diagnosed with breast cancer under the age of 36 and are all deceased.   -1 paternal half aunt has no history of cancer.  Ms. Folts has other paternal cousins without any history of cancer.  Ms. Vandalen paternal grandmother had breast cancer in her 49's.  Her maternal great grandmother also had breast cancer, age unk.  No information is known about her paternal grandfather.    Ms. Coonrod mother is 35 and has COPD. She is an only child.  Ms. Shimada maternal grandmother had ovarian cancer at 47. And her maternal grandfather also had cancer, type unknown. She reports that he died 1 month after dx of this cancer.   Ms. Meter is unaware of previous family history of genetic testing for hereditary cancer risks. She reports she has talked to her family about genetic testing for years, but none of her relatives have pursued it to her knowledge. Patient's maternal ancestors are of Black/African American descent, and paternal ancestors are of Native American/Caucasian descent. There is no reported Ashkenazi Jewish ancestry. There is no known consanguinity.  GENETIC TEST RESULTS: Genetic testing performed through [facility] Common Hereditary Cancer Panel + Myelodysplastic Syndrome/Leukemia Panel + Gastric Cancer panel reported out on 06/27/2017 showed no pathogenic mutations. The following genes were evaluated for sequence changes and exonic  deletions/duplications: APC, ATM, AXIN2, BARD1, BLM, BMPR1A, BRCA1, BRCA2, BRIP1, CDH1, CDK4, CDKN2A (p14ARF), CDKN2A (p16INK4a), CEBPA, CHEK2, CTNNA1, DICER1, EPCAM*, GATA2, GREM1*, HRAS, KIT, MEN1, MLH1, MSH2, MSH3, MSH6, MUTYH, NBN, NF1, PALB2, PDGFRA, PMS2, POLD1, POLE, PTEN, RAD50, RAD51C, RAD51D, RUNX1, SDHB, SDHC, SDHD, SMAD4, SMARCA4, STK11, TERC, TERT, TP53, TSC1, TSC2, VHL. The following genes were evaluated for sequence changes only:HOXB13*, NTHL1*, SDHA.  A variant of uncertain significance (VUS) in a gene called MEN1 was also noted. c.839T>C (p.Leu280Ser) A variant of uncertain significance (VUS) in a gene called PMS2 was also noted. c.1559C>T (p.Ala520Val)  The test report will be scanned into EPIC and will be located under the Molecular Pathology section of the Results Review tab.A portion of the result report is included below for reference.  We discussed with Ms. Greenwalt that because current genetic testing is not perfect, it is possible there may be a gene mutation in one of these genes that current testing cannot detect, but that chance is small. We also discussed, that there could be another gene that has not yet been discovered, or that we have not yet tested, that is responsible for the cancer diagnoses in the family. Therefore, it is important to remain in touch with cancer genetics in the future so that we can continue to offer Ms. Matherne the most up to date genetic testing.   Regarding the VUS's in MEN1 and PMS2: At this time, it is unknown if these variants are associated with increased cancer risk or if they are normal findings, but most variants such as these get reclassified to being inconsequential. They should not be used to make medical management decisions. With time, we suspect the lab will determine the significance of these variants, if any. If we do learn more about them, we will try to contact Ms. Everding to discuss it further. However, it is important to stay in  touch with [place] periodically and keep the address and phone number up to date.  ADDITIONAL GENETIC TESTING: We discussed with Ms. Rayfield that there are other genes that are associated with increased cancer risk that can be analyzed. The laboratories that offer this testing look at these additional genes via a hereditary cancer gene panel. Should Ms. Drennen wish to pursue additional genetic testing, we are happy to discuss and coordinate this testing, at any time.    CANCER SCREENING RECOMMENDATIONS: Given Ms. Yakel's personal and family histories, we must consider these negative results carefully.  Families with features suggestive of hereditary risk for cancer tend to have multiple family members with cancer, diagnoses in multiple generations, and diagnoses before the age of 43. Ms. Ahart's family exhibits some of these features. Therefore this negative result may actually be due to the limitations of current technology to detect all mutations within these genes, or there may be a different gene that has not yet been discovered or tested. It is also possible that there could be a mutation causing the cancer in the family that Ms. Krempasky did not inherit and therefore was not identified in her testing.   Therefore, it is recommended she continue to follow the cancer management and screening guidelines provided by her oncology and primary healthcare providers. She is still at a somewhat increased risk for cancer simply given the family history.    RECOMMENDATIONS FOR FAMILY MEMBERS: Individuals in this family are at some increased risk of developing cancer, over the general population risk, simply due to the family history of cancer. We recommended women in this family have a yearly mammogram beginning at age 31, or 64 years younger than the earliest onset of cancer, an annual clinical breast exam, and perform monthly breast self-exams. Women in this family should also have a gynecological exam as recommended by  their primary provider. All family members should have a colonoscopy by age 61. All relatives should tell their physicians about the family history of cancer so they can make the most appropriate individual recommendations for them.   Based on Ms. Trammel's family history, we strongly recommended all of her relatives, especially those affected with cancer, have genetic counseling and testing. Ms. Kue will let us know if we can be of any assistance in coordinating genetic counseling and/or testing for these family members.   FOLLOW-UP: Lastly, we discussed  with Ms. Westerlund that cancer genetics is a rapidly advancing field and it is possible that new genetic tests will be appropriate for her and/or her family members in the future. We encouraged her to remain in contact with cancer genetics on an annual basis so we can update her personal and family histories and let her know of advances in cancer genetics that may benefit this family.   Our contact number was provided. Ms. Lasecki questions were answered to her satisfaction, and she knows she is welcome to call us at anytime with additional questions or concerns.   Ferol Luz, MS Genetic Counselor [email or web address]

## 2017-07-07 ENCOUNTER — Ambulatory Visit: Payer: 59

## 2017-07-12 ENCOUNTER — Ambulatory Visit: Payer: 59

## 2017-07-12 ENCOUNTER — Other Ambulatory Visit: Payer: Self-pay

## 2017-07-12 ENCOUNTER — Encounter: Payer: Self-pay | Admitting: Hematology & Oncology

## 2017-07-12 ENCOUNTER — Other Ambulatory Visit: Payer: Self-pay | Admitting: *Deleted

## 2017-07-12 ENCOUNTER — Ambulatory Visit (HOSPITAL_BASED_OUTPATIENT_CLINIC_OR_DEPARTMENT_OTHER): Payer: 59 | Admitting: Hematology & Oncology

## 2017-07-12 ENCOUNTER — Other Ambulatory Visit (HOSPITAL_BASED_OUTPATIENT_CLINIC_OR_DEPARTMENT_OTHER): Payer: 59

## 2017-07-12 ENCOUNTER — Ambulatory Visit (HOSPITAL_BASED_OUTPATIENT_CLINIC_OR_DEPARTMENT_OTHER): Payer: 59

## 2017-07-12 DIAGNOSIS — Z5189 Encounter for other specified aftercare: Secondary | ICD-10-CM | POA: Diagnosis not present

## 2017-07-12 DIAGNOSIS — Z170 Estrogen receptor positive status [ER+]: Secondary | ICD-10-CM | POA: Diagnosis not present

## 2017-07-12 DIAGNOSIS — C50511 Malignant neoplasm of lower-outer quadrant of right female breast: Secondary | ICD-10-CM

## 2017-07-12 DIAGNOSIS — Z5112 Encounter for antineoplastic immunotherapy: Secondary | ICD-10-CM

## 2017-07-12 DIAGNOSIS — Z803 Family history of malignant neoplasm of breast: Secondary | ICD-10-CM

## 2017-07-12 LAB — CBC WITH DIFFERENTIAL (CANCER CENTER ONLY)
BASO#: 0 10*3/uL (ref 0.0–0.2)
BASO%: 0.1 % (ref 0.0–2.0)
EOS%: 0 % (ref 0.0–7.0)
Eosinophils Absolute: 0 10*3/uL (ref 0.0–0.5)
HCT: 28.9 % — ABNORMAL LOW (ref 34.8–46.6)
HGB: 9.7 g/dL — ABNORMAL LOW (ref 11.6–15.9)
LYMPH#: 0.9 10*3/uL (ref 0.9–3.3)
LYMPH%: 8 % — AB (ref 14.0–48.0)
MCH: 29.8 pg (ref 26.0–34.0)
MCHC: 33.6 g/dL (ref 32.0–36.0)
MCV: 89 fL (ref 81–101)
MONO#: 0.6 10*3/uL (ref 0.1–0.9)
MONO%: 6 % (ref 0.0–13.0)
NEUT#: 9.2 10*3/uL — ABNORMAL HIGH (ref 1.5–6.5)
NEUT%: 85.9 % — ABNORMAL HIGH (ref 39.6–80.0)
PLATELETS: 437 10*3/uL — AB (ref 145–400)
RBC: 3.26 10*6/uL — AB (ref 3.70–5.32)
RDW: 13 % (ref 11.1–15.7)
WBC: 10.7 10*3/uL — AB (ref 3.9–10.0)

## 2017-07-12 LAB — CMP (CANCER CENTER ONLY)
ALK PHOS: 59 U/L (ref 26–84)
ALT: 13 U/L (ref 10–47)
AST: 17 U/L (ref 11–38)
Albumin: 3.5 g/dL (ref 3.3–5.5)
BUN: 10 mg/dL (ref 7–22)
CALCIUM: 9.8 mg/dL (ref 8.0–10.3)
CO2: 27 mEq/L (ref 18–33)
Chloride: 105 mEq/L (ref 98–108)
Creat: 0.7 mg/dl (ref 0.6–1.2)
Glucose, Bld: 133 mg/dL — ABNORMAL HIGH (ref 73–118)
POTASSIUM: 3.5 meq/L (ref 3.3–4.7)
Sodium: 145 mEq/L (ref 128–145)
TOTAL PROTEIN: 7.1 g/dL (ref 6.4–8.1)
Total Bilirubin: 0.5 mg/dl (ref 0.20–1.60)

## 2017-07-12 MED ORDER — SODIUM CHLORIDE 0.9 % IV SOLN: Freq: Once | INTRAVENOUS | Status: DC | PRN

## 2017-07-12 MED ORDER — PALONOSETRON HCL INJECTION 0.25 MG/5ML
0.2500 mg | Freq: Once | INTRAVENOUS | Status: AC
  Administered 2017-07-12: 0.25 mg via INTRAVENOUS

## 2017-07-12 MED ORDER — TRASTUZUMAB CHEMO 150 MG IV SOLR
300.0000 mg | Freq: Once | INTRAVENOUS | Status: AC
  Administered 2017-07-12: 300 mg via INTRAVENOUS
  Filled 2017-07-12: qty 14.29

## 2017-07-12 MED ORDER — SODIUM CHLORIDE 0.9 % IV SOLN
Freq: Once | INTRAVENOUS | Status: AC
  Administered 2017-07-12: 10:00:00 via INTRAVENOUS

## 2017-07-12 MED ORDER — SODIUM CHLORIDE 0.9% FLUSH
10.0000 mL | INTRAVENOUS | Status: DC | PRN
  Administered 2017-07-12: 10 mL
  Filled 2017-07-12: qty 10

## 2017-07-12 MED ORDER — DIPHENHYDRAMINE HCL 25 MG PO CAPS
ORAL_CAPSULE | ORAL | Status: AC
  Filled 2017-07-12: qty 2

## 2017-07-12 MED ORDER — SUVOREXANT 10 MG PO TABS: 10.0000 mg | ORAL_TABLET | Freq: Every evening | ORAL | 0 refills | Status: DC | PRN

## 2017-07-12 MED ORDER — HEPARIN SOD (PORK) LOCK FLUSH 100 UNIT/ML IV SOLN
500.0000 [IU] | Freq: Once | INTRAVENOUS | Status: AC | PRN
  Administered 2017-07-12: 500 [IU]
  Filled 2017-07-12: qty 5

## 2017-07-12 MED ORDER — PEGFILGRASTIM 6 MG/0.6ML ~~LOC~~ PSKT
PREFILLED_SYRINGE | SUBCUTANEOUS | Status: AC
  Filled 2017-07-12: qty 0.6

## 2017-07-12 MED ORDER — DIPHENHYDRAMINE HCL 25 MG PO CAPS
50.0000 mg | ORAL_CAPSULE | Freq: Once | ORAL | Status: AC
  Administered 2017-07-12: 50 mg via ORAL

## 2017-07-12 MED ORDER — DEXAMETHASONE SODIUM PHOSPHATE 10 MG/ML IJ SOLN
10.0000 mg | Freq: Once | INTRAMUSCULAR | Status: AC
  Administered 2017-07-12: 10 mg via INTRAVENOUS

## 2017-07-12 MED ORDER — SODIUM CHLORIDE 0.9 % IV SOLN
420.0000 mg | Freq: Once | INTRAVENOUS | Status: AC
  Administered 2017-07-12: 420 mg via INTRAVENOUS
  Filled 2017-07-12: qty 14

## 2017-07-12 MED ORDER — PALONOSETRON HCL INJECTION 0.25 MG/5ML
INTRAVENOUS | Status: AC
  Filled 2017-07-12: qty 5

## 2017-07-12 MED ORDER — DEXAMETHASONE SODIUM PHOSPHATE 10 MG/ML IJ SOLN
INTRAMUSCULAR | Status: AC
  Filled 2017-07-12: qty 1

## 2017-07-12 MED ORDER — PEGFILGRASTIM 6 MG/0.6ML ~~LOC~~ PSKT
6.0000 mg | PREFILLED_SYRINGE | Freq: Once | SUBCUTANEOUS | Status: AC
  Administered 2017-07-12: 6 mg via SUBCUTANEOUS

## 2017-07-12 MED ORDER — DOCETAXEL CHEMO INJECTION 160 MG/16ML
67.5000 mg/m2 | Freq: Once | INTRAVENOUS | Status: AC
  Administered 2017-07-12: 100 mg via INTRAVENOUS
  Filled 2017-07-12: qty 10

## 2017-07-12 MED ORDER — ACETAMINOPHEN 325 MG PO TABS
ORAL_TABLET | ORAL | Status: AC
  Filled 2017-07-12: qty 2

## 2017-07-12 MED ORDER — SODIUM CHLORIDE 0.9 % IV SOLN
540.0000 mg/m2 | Freq: Once | INTRAVENOUS | Status: AC
  Administered 2017-07-12: 820 mg via INTRAVENOUS
  Filled 2017-07-12: qty 41

## 2017-07-12 MED ORDER — ACETAMINOPHEN 325 MG PO TABS
650.0000 mg | ORAL_TABLET | Freq: Once | ORAL | Status: AC
  Administered 2017-07-12: 650 mg via ORAL

## 2017-07-12 NOTE — Patient Instructions (Signed)
[facility] Discharge Instructions for Patients Receiving Chemotherapy  Today you received the following chemotherapy agents Herceptin, Perjeta, Taxotere, Cytoxan.  To help prevent nausea and vomiting after your treatment, we encourage you to take your nausea medication per your provider instructions. Do not take Zofran for 3 days.   If you develop nausea and vomiting that is not controlled by your nausea medication, call the clinic.   BELOW ARE SYMPTOMS THAT SHOULD BE REPORTED IMMEDIATELY:  *FEVER GREATER THAN 100.5 F  *CHILLS WITH OR WITHOUT FEVER  NAUSEA AND VOMITING THAT IS NOT CONTROLLED WITH YOUR NAUSEA MEDICATION  *UNUSUAL SHORTNESS OF BREATH  *UNUSUAL BRUISING OR BLEEDING  TENDERNESS IN MOUTH AND THROAT WITH OR WITHOUT PRESENCE OF ULCERS  *URINARY PROBLEMS  *BOWEL PROBLEMS  UNUSUAL RASH Items with * indicate a potential emergency and should be followed up as soon as possible.  Feel free to call the clinic should you have any questions or concerns. The clinic phone number is [phone].  Please show the [facility] at check-in to the Emergency Department and triage nurse.

## 2017-07-12 NOTE — Progress Notes (Signed)
Hematology and Oncology Follow Up Visit  Deanna Reynolds [id number] 1951/08/04 66 y.o. 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  She is doing fairly well.  She did have a tough time with the first cycle of chemotherapy.  She actually may have had a problem with the Neulasta injection.  Unfortunately, she cannot take Claritin.  I asked her why she has a problem with Claritin.  She says it just makes her feel "funny."  She had no mouth sores.  She has had no fever.  She has had no nausea or vomiting.  She has had no bleeding.  There is been no diarrhea.  Her hair has started to come out.  This really has not bothered her all that much.  Overall, her performance status is ECOG 1.  Medications:  Current Outpatient Medications:  .  Ascorbic Acid (VITAMIN C) 1000 MG tablet, Take 1,000 mg by mouth daily., Disp: , Rfl:  .  Cholecalciferol (VITAMIN D-3) 5000 units TABS, Take 1 tablet by mouth daily., Disp: , Rfl:  .  Cyanocobalamin (VITAMIN B-12 PO), Take 1 tablet by mouth daily., Disp: , Rfl:  .  Flaxseed, Linseed, (FLAX SEED OIL PO), Take by mouth. One teaspoon daily, Disp: , Rfl:  .  hydrochlorothiazide (HYDRODIURIL) 25 MG tablet, 25 mg., Disp: , Rfl:  .  lidocaine-prilocaine (EMLA) cream, Apply 1 application topically as needed., Disp: 30 g, Rfl: 0 .  losartan (COZAAR) 100 MG tablet, , Disp: , Rfl:  .  MAGNESIUM PO, Take 1 tablet by mouth daily., Disp: , Rfl:  .  VITAMIN E PO, Take 1 tablet by mouth daily., Disp: , Rfl:  .  HYDROcodone-acetaminophen (NORCO/VICODIN) 5-325 MG tablet, Take 1-2 tablets by mouth every 6 (six) hours as needed for moderate pain. (Patient not taking: Reported on 05/30/2017), Disp: 30 tablet, Rfl: 0 .  HYDROcodone-acetaminophen (NORCO/VICODIN) 5-325 MG tablet, Take 1 tablet by mouth every  4 (four) hours as needed for moderate pain. (Patient not taking: Reported on 06/12/2017), Disp: 30 tablet, Rfl: 0 .  zolpidem (AMBIEN CR) 6.25 MG CR tablet, Take 1 tablet (6.25 mg total) by mouth at bedtime as needed for sleep. (Patient not taking: Reported on 05/30/2017), Disp: 20 tablet, Rfl: 0  Allergies:  Allergies  Allergen Reactions  . Motrin [Ibuprofen] Nausea And Vomiting    Past Medical History, Surgical history, Social history, and Family History were reviewed and updated.  Review of Systems: Review of Systems  Constitutional: Negative for appetite change, fatigue, fever and unexpected weight change.  HENT:   Negative for lump/mass, mouth sores, sore throat and trouble swallowing.   Respiratory: Negative for cough, hemoptysis and shortness of breath.   Cardiovascular: Negative for leg swelling and palpitations.  Gastrointestinal: Negative for abdominal distention, abdominal pain, blood in stool, constipation, diarrhea, nausea and vomiting.  Genitourinary: Negative for bladder incontinence, dysuria, frequency and hematuria.   Musculoskeletal: Negative for arthralgias, back pain, gait problem and myalgias.  Skin: Negative for itching and rash.  Neurological: Negative for dizziness, extremity weakness, gait problem, headaches, numbness, seizures and speech difficulty.  Hematological: Does not bruise/bleed easily.  Psychiatric/Behavioral: Negative for depression and sleep disturbance. The patient is not nervous/anxious.     Physical Exam:  weight is 114 lb (51.7 kg). Her oral temperature is 98.6  F (37 C). Her blood pressure is 136/82 and her pulse is 77. Her respiration is 20.   Wt Readings from Last 3 Encounters:  06/12/17 114 lb (51.7 kg)  05/30/17 115 lb 9.6 oz (52.4 kg)  05/16/17 114 lb 3.2 oz (51.8 kg)   Petite African-American female in no obvious distress.  Head neck exam shows no ocular or oral lesions.  There are no palpable cervical or supraclavicular lymph nodes.   Lungs are clear bilaterally.  Cardiac exam regular rate and rhythm with no murmurs, rubs or bruits.  Abdomen is soft.  She has good bowel sounds.  There is no fluid wave.  There is no palpable liver or spleen tip.  Breast exam shows left breast with no masses, edema or erythema.  There is no left axillary adenopathy.  Right breast shows the well-healed lumpectomy at the 7 o'clock position just adjacent to the areola.  There is some tenderness at the lumpectomy site.  There is some slight ecchymoses.  She has no obvious mass.  There is no right axillary adenopathy.  Back exam shows no tenderness over the spine, ribs or hips.  Extremities shows no clubbing, cyanosis or edema.  Skin exam shows no rashes, ecchymoses or petechia.   Lab Results  Component Value Date   WBC 6.8 06/12/2017   HGB 11.7 06/12/2017   HCT 35.0 06/12/2017   MCV 90 06/12/2017   PLT 228 06/12/2017     Chemistry      Component Value Date/Time   NA 138 04/21/2017 1204   NA 137 11/02/2016 1421   K 3.7 04/21/2017 1204   K 3.6 11/02/2016 1421   CL 105 04/21/2017 1204   CL 101 11/02/2016 1421   CO2 25 04/21/2017 1204   CO2 28 11/02/2016 1421   BUN 10 04/21/2017 1204   BUN 11 11/02/2016 1421   CREATININE 0.86 04/21/2017 1204   CREATININE 0.63 11/02/2016 1421      Component Value Date/Time   CALCIUM 9.5 04/21/2017 1204   CALCIUM 9.4 11/02/2016 1421   ALKPHOS 57 11/02/2016 1421   AST 14 11/02/2016 1421   ALT 10 11/02/2016 1421   BILITOT 0.3 11/02/2016 1421         Impression and Plan: Deanna Reynolds is a 66 year old postmenopausal African-American female with a newly diagnosed stage IA infiltrating ductal carcinoma of the right breast.  She has an incredible family history of breast cancer.  I would have to believe that she will have a breast cancer gene.  For right now, we will proceed with her second of 4 cycles of chemotherapy.  I will plan to get her back in 3 weeks.  She does not mind coming back the day after  Christmas.     Volanda Napoleon, MD 11/5/20184:32 PM

## 2017-07-12 NOTE — Addendum Note (Signed)
Addended by: Burney Gauze R on: 07/12/2017 05:26 PM   Modules accepted: Orders

## 2017-07-14 ENCOUNTER — Ambulatory Visit: Payer: 59

## 2017-07-20 ENCOUNTER — Telehealth: Payer: Self-pay | Admitting: *Deleted

## 2017-07-20 ENCOUNTER — Other Ambulatory Visit: Payer: Self-pay | Admitting: *Deleted

## 2017-07-20 MED ORDER — DIPHENOXYLATE-ATROPINE 2.5-0.025 MG PO TABS: ORAL_TABLET | ORAL | 3 refills | Status: DC

## 2017-07-20 NOTE — Telephone Encounter (Signed)
Patient c/o diarrhea for 6 days. She has tried imodium without improvement.   Dr Marin Olp will send a prescription for lomotil to her pharmacy. She has instructions on how to take. She will call office tomorrow afternoon if there is no improvement in symptoms.

## 2017-08-02 ENCOUNTER — Ambulatory Visit: Payer: 59

## 2017-08-02 ENCOUNTER — Other Ambulatory Visit (HOSPITAL_BASED_OUTPATIENT_CLINIC_OR_DEPARTMENT_OTHER): Payer: 59

## 2017-08-02 ENCOUNTER — Other Ambulatory Visit: Payer: Self-pay | Admitting: *Deleted

## 2017-08-02 ENCOUNTER — Ambulatory Visit (HOSPITAL_BASED_OUTPATIENT_CLINIC_OR_DEPARTMENT_OTHER): Payer: 59 | Admitting: Hematology & Oncology

## 2017-08-02 ENCOUNTER — Other Ambulatory Visit: Payer: Self-pay

## 2017-08-02 ENCOUNTER — Ambulatory Visit (HOSPITAL_BASED_OUTPATIENT_CLINIC_OR_DEPARTMENT_OTHER): Payer: 59

## 2017-08-02 VITALS — BP 161/83 | HR 83 | Temp 98.0°F | Resp 20 | Wt 116.8 lb

## 2017-08-02 DIAGNOSIS — Z5112 Encounter for antineoplastic immunotherapy: Secondary | ICD-10-CM

## 2017-08-02 DIAGNOSIS — Z170 Estrogen receptor positive status [ER+]: Secondary | ICD-10-CM

## 2017-08-02 DIAGNOSIS — Z803 Family history of malignant neoplasm of breast: Secondary | ICD-10-CM | POA: Diagnosis not present

## 2017-08-02 DIAGNOSIS — C50511 Malignant neoplasm of lower-outer quadrant of right female breast: Secondary | ICD-10-CM

## 2017-08-02 LAB — CBC WITH DIFFERENTIAL (CANCER CENTER ONLY)
BASO#: 0 10*3/uL (ref 0.0–0.2)
BASO%: 0.1 % (ref 0.0–2.0)
EOS ABS: 0 10*3/uL (ref 0.0–0.5)
EOS%: 0 % (ref 0.0–7.0)
HCT: 27.2 % — ABNORMAL LOW (ref 34.8–46.6)
HGB: 9.1 g/dL — ABNORMAL LOW (ref 11.6–15.9)
LYMPH#: 1 10*3/uL (ref 0.9–3.3)
LYMPH%: 8 % — AB (ref 14.0–48.0)
MCH: 30 pg (ref 26.0–34.0)
MCHC: 33.5 g/dL (ref 32.0–36.0)
MCV: 90 fL (ref 81–101)
MONO#: 1.6 10*3/uL — AB (ref 0.1–0.9)
MONO%: 13 % (ref 0.0–13.0)
NEUT#: 9.6 10*3/uL — ABNORMAL HIGH (ref 1.5–6.5)
NEUT%: 78.9 % (ref 39.6–80.0)
PLATELETS: 288 10*3/uL (ref 145–400)
RBC: 3.03 10*6/uL — AB (ref 3.70–5.32)
RDW: 13.6 % (ref 11.1–15.7)
WBC: 12.1 10*3/uL — ABNORMAL HIGH (ref 3.9–10.0)

## 2017-08-02 LAB — CMP (CANCER CENTER ONLY)
ALT(SGPT): 23 U/L (ref 10–47)
AST: 19 U/L (ref 11–38)
Albumin: 3.2 g/dL — ABNORMAL LOW (ref 3.3–5.5)
Alkaline Phosphatase: 51 U/L (ref 26–84)
BUN: 10 mg/dL (ref 7–22)
CHLORIDE: 105 meq/L (ref 98–108)
CO2: 28 mEq/L (ref 18–33)
Calcium: 9.5 mg/dL (ref 8.0–10.3)
Creat: 0.7 mg/dl (ref 0.6–1.2)
Glucose, Bld: 106 mg/dL (ref 73–118)
POTASSIUM: 3.5 meq/L (ref 3.3–4.7)
Sodium: 141 mEq/L (ref 128–145)
TOTAL PROTEIN: 6.4 g/dL (ref 6.4–8.1)
Total Bilirubin: 0.5 mg/dl (ref 0.20–1.60)

## 2017-08-02 MED ORDER — DOCETAXEL CHEMO INJECTION 160 MG/16ML
60.7500 mg/m2 | Freq: Once | INTRAVENOUS | Status: AC
  Administered 2017-08-02: 90 mg via INTRAVENOUS
  Filled 2017-08-02: qty 9

## 2017-08-02 MED ORDER — PALONOSETRON HCL INJECTION 0.25 MG/5ML
INTRAVENOUS | Status: AC
  Filled 2017-08-02: qty 5

## 2017-08-02 MED ORDER — DIPHENHYDRAMINE HCL 25 MG PO CAPS
50.0000 mg | ORAL_CAPSULE | Freq: Once | ORAL | Status: AC
  Administered 2017-08-02: 50 mg via ORAL

## 2017-08-02 MED ORDER — PEGFILGRASTIM 6 MG/0.6ML ~~LOC~~ PSKT: 6.0000 mg | PREFILLED_SYRINGE | Freq: Once | SUBCUTANEOUS | Status: DC

## 2017-08-02 MED ORDER — DIPHENHYDRAMINE HCL 25 MG PO CAPS
ORAL_CAPSULE | ORAL | Status: AC
  Filled 2017-08-02: qty 2

## 2017-08-02 MED ORDER — SODIUM CHLORIDE 0.9 % IV SOLN
420.0000 mg | Freq: Once | INTRAVENOUS | Status: AC
  Administered 2017-08-02: 420 mg via INTRAVENOUS
  Filled 2017-08-02: qty 14

## 2017-08-02 MED ORDER — SODIUM CHLORIDE 0.9 % IV SOLN
540.0000 mg/m2 | Freq: Once | INTRAVENOUS | Status: AC
  Administered 2017-08-02: 820 mg via INTRAVENOUS
  Filled 2017-08-02: qty 41

## 2017-08-02 MED ORDER — LORAZEPAM 0.5 MG PO TABS: 0.5000 mg | ORAL_TABLET | Freq: Four times a day (QID) | ORAL | 0 refills | Status: DC | PRN

## 2017-08-02 MED ORDER — SODIUM CHLORIDE 0.9% FLUSH
10.0000 mL | INTRAVENOUS | Status: DC | PRN
  Administered 2017-08-02: 10 mL
  Filled 2017-08-02: qty 10

## 2017-08-02 MED ORDER — SODIUM CHLORIDE 0.9 % IV SOLN
Freq: Once | INTRAVENOUS | Status: AC
  Administered 2017-08-02: 11:00:00 via INTRAVENOUS

## 2017-08-02 MED ORDER — TRAMADOL HCL 50 MG PO TABS: 50.0000 mg | ORAL_TABLET | Freq: Four times a day (QID) | ORAL | 0 refills | Status: DC | PRN

## 2017-08-02 MED ORDER — ACETAMINOPHEN 325 MG PO TABS
650.0000 mg | ORAL_TABLET | Freq: Once | ORAL | Status: AC
  Administered 2017-08-02: 650 mg via ORAL

## 2017-08-02 MED ORDER — PEGFILGRASTIM 6 MG/0.6ML ~~LOC~~ PSKT
PREFILLED_SYRINGE | SUBCUTANEOUS | Status: AC
  Filled 2017-08-02: qty 0.6

## 2017-08-02 MED ORDER — TRASTUZUMAB CHEMO 150 MG IV SOLR
300.0000 mg | Freq: Once | INTRAVENOUS | Status: AC
  Administered 2017-08-02: 300 mg via INTRAVENOUS
  Filled 2017-08-02: qty 14.29

## 2017-08-02 MED ORDER — PALONOSETRON HCL INJECTION 0.25 MG/5ML
0.2500 mg | Freq: Once | INTRAVENOUS | Status: AC
  Administered 2017-08-02: 0.25 mg via INTRAVENOUS

## 2017-08-02 MED ORDER — HEPARIN SOD (PORK) LOCK FLUSH 100 UNIT/ML IV SOLN
500.0000 [IU] | Freq: Once | INTRAVENOUS | Status: AC | PRN
  Administered 2017-08-02: 500 [IU]
  Filled 2017-08-02: qty 5

## 2017-08-02 MED ORDER — ACETAMINOPHEN 325 MG PO TABS
ORAL_TABLET | ORAL | Status: AC
  Filled 2017-08-02: qty 2

## 2017-08-02 MED ORDER — DEXAMETHASONE SODIUM PHOSPHATE 10 MG/ML IJ SOLN
10.0000 mg | Freq: Once | INTRAMUSCULAR | Status: AC
  Administered 2017-08-02: 10 mg via INTRAVENOUS

## 2017-08-02 MED ORDER — DEXAMETHASONE SODIUM PHOSPHATE 10 MG/ML IJ SOLN
INTRAMUSCULAR | Status: AC
  Filled 2017-08-02: qty 1

## 2017-08-02 NOTE — Progress Notes (Signed)
Hematology and Oncology Follow Up Visit  Deanna Reynolds [id number] 1951-08-02 66 y.o. 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  She is doing fairly well.  She did have a tough time with the second cycle of chemotherapy.  She had a lot of diarrhea.  She had diarrhea for 4 days.  We finally had to give her some Lomotil and this helped the diarrhea.  She lost some weight but has gained some weight back.    She had no mouth sores.  She has had no fever.  She has had no nausea or vomiting.  She has had no bleeding.    Her hair has started to come out.  This really has not bothered her all that much.  Overall, her performance status is ECOG 1.  Medications:  Current Outpatient Medications:  .  Ascorbic Acid (VITAMIN C) 1000 MG tablet, Take 1,000 mg by mouth daily., Disp: , Rfl:  .  Cholecalciferol (VITAMIN D-3) 5000 units TABS, Take 1 tablet by mouth daily., Disp: , Rfl:  .  Cyanocobalamin (VITAMIN B-12 PO), Take 1 tablet by mouth daily., Disp: , Rfl:  .  Flaxseed, Linseed, (FLAX SEED OIL PO), Take by mouth. One teaspoon daily, Disp: , Rfl:  .  hydrochlorothiazide (HYDRODIURIL) 25 MG tablet, 25 mg., Disp: , Rfl:  .  lidocaine-prilocaine (EMLA) cream, Apply 1 application topically as needed., Disp: 30 g, Rfl: 0 .  losartan (COZAAR) 100 MG tablet, , Disp: , Rfl:  .  MAGNESIUM PO, Take 1 tablet by mouth daily., Disp: , Rfl:  .  VITAMIN E PO, Take 1 tablet by mouth daily., Disp: , Rfl:  .  HYDROcodone-acetaminophen (NORCO/VICODIN) 5-325 MG tablet, Take 1-2 tablets by mouth every 6 (six) hours as needed for moderate pain. (Patient not taking: Reported on 05/30/2017), Disp: 30 tablet, Rfl: 0 .  HYDROcodone-acetaminophen (NORCO/VICODIN) 5-325 MG tablet, Take 1 tablet by mouth every 4 (four) hours as needed for moderate  pain. (Patient not taking: Reported on 06/12/2017), Disp: 30 tablet, Rfl: 0 .  zolpidem (AMBIEN CR) 6.25 MG CR tablet, Take 1 tablet (6.25 mg total) by mouth at bedtime as needed for sleep. (Patient not taking: Reported on 05/30/2017), Disp: 20 tablet, Rfl: 0  Allergies:  Allergies  Allergen Reactions  . Motrin [Ibuprofen] Nausea And Vomiting    Past Medical History, Surgical history, Social history, and Family History were reviewed and updated.  Review of Systems: Review of Systems  Constitutional: Negative for appetite change, fatigue, fever and unexpected weight change.  HENT:   Negative for lump/mass, mouth sores, sore throat and trouble swallowing.   Respiratory: Negative for cough, hemoptysis and shortness of breath.   Cardiovascular: Negative for leg swelling and palpitations.  Gastrointestinal: Negative for abdominal distention, abdominal pain, blood in stool, constipation, diarrhea, nausea and vomiting.  Genitourinary: Negative for bladder incontinence, dysuria, frequency and hematuria.   Musculoskeletal: Negative for arthralgias, back pain, gait problem and myalgias.  Skin: Negative for itching and rash.  Neurological: Negative for dizziness, extremity weakness, gait problem, headaches, numbness, seizures and speech difficulty.  Hematological: Does not bruise/bleed easily.  Psychiatric/Behavioral: Negative for depression and sleep disturbance. The patient is not nervous/anxious.     Physical Exam:  weight is 114 lb (51.7 kg). Her oral temperature is 98.6 F (  37 C). Her blood pressure is 136/82 and her pulse is 77. Her respiration is 20.   Wt Readings from Last 3 Encounters:  06/12/17 114 lb (51.7 kg)  05/30/17 115 lb 9.6 oz (52.4 kg)  05/16/17 114 lb 3.2 oz (51.8 kg)   Petite African-American female in no obvious distress.  Head neck exam shows no ocular or oral lesions.  There are no palpable cervical or supraclavicular lymph nodes.  Lungs are clear bilaterally.  Cardiac  exam regular rate and rhythm with no murmurs, rubs or bruits.  Abdomen is soft.  She has good bowel sounds.  There is no fluid wave.  There is no palpable liver or spleen tip.  Breast exam shows left breast with no masses, edema or erythema.  There is no left axillary adenopathy.  Right breast shows the well-healed lumpectomy at the 7 o'clock position just adjacent to the areola.  There is some tenderness at the lumpectomy site.  There is some slight ecchymoses.  She has no obvious mass.  There is no right axillary adenopathy.  Back exam shows no tenderness over the spine, ribs or hips.  Extremities shows no clubbing, cyanosis or edema.  Skin exam shows no rashes, ecchymoses or petechia.   Lab Results  Component Value Date   WBC 6.8 06/12/2017   HGB 11.7 06/12/2017   HCT 35.0 06/12/2017   MCV 90 06/12/2017   PLT 228 06/12/2017     Chemistry      Component Value Date/Time   NA 138 04/21/2017 1204   NA 137 11/02/2016 1421   K 3.7 04/21/2017 1204   K 3.6 11/02/2016 1421   CL 105 04/21/2017 1204   CL 101 11/02/2016 1421   CO2 25 04/21/2017 1204   CO2 28 11/02/2016 1421   BUN 10 04/21/2017 1204   BUN 11 11/02/2016 1421   CREATININE 0.86 04/21/2017 1204   CREATININE 0.63 11/02/2016 1421      Component Value Date/Time   CALCIUM 9.5 04/21/2017 1204   CALCIUM 9.4 11/02/2016 1421   ALKPHOS 57 11/02/2016 1421   AST 14 11/02/2016 1421   ALT 10 11/02/2016 1421   BILITOT 0.3 11/02/2016 1421         Impression and Plan: Deanna Reynolds is a 66 year old postmenopausal African-American female with a newly diagnosed stage IA infiltrating ductal carcinoma of the right breast.  She has an incredible family history of breast cancer.  I would have to believe that she will have a breast cancer gene.  For right now, we will proceed with her 3rd of 4 cycles of chemotherapy.  I am going to adjust her Taxotere dose down by 10% to see if this helps with her diarrhea.  I will plan to get her back in 3  weeks.    Volanda Napoleon, MD 11/5/20184:32 PM

## 2017-08-02 NOTE — Patient Instructions (Signed)
Cyclophosphamide injection What is this medicine? CYCLOPHOSPHAMIDE (sye kloe FOSS fa mide) is a chemotherapy drug. It slows the growth of cancer cells. This medicine is used to treat many types of cancer like lymphoma, myeloma, leukemia, breast cancer, and ovarian cancer, to name a few. This medicine may be used for other purposes; ask your health care provider or pharmacist if you have questions. COMMON BRAND NAME(S): Cytoxan, Neosar What should I tell my health care provider before I take this medicine? They need to know if you have any of these conditions: -blood disorders -history of other chemotherapy -infection -kidney disease -liver disease -recent or ongoing radiation therapy -tumors in the bone marrow -an unusual or allergic reaction to cyclophosphamide, other chemotherapy, other medicines, foods, dyes, or preservatives -pregnant or trying to get pregnant -breast-feeding How should I use this medicine? This drug is usually given as an injection into a vein or muscle or by infusion into a vein. It is administered in a hospital or clinic by a specially trained health care professional. Talk to your pediatrician regarding the use of this medicine in children. Special care may be needed. Overdosage: If you think you have taken too much of this medicine contact a poison control center or emergency room at once. NOTE: This medicine is only for you. Do not share this medicine with others. What if I miss a dose? It is important not to miss your dose. Call your doctor or health care professional if you are unable to keep an appointment. What may interact with this medicine? This medicine may interact with the following medications: -amiodarone -amphotericin B -azathioprine -certain antiviral medicines for HIV or AIDS such as protease inhibitors (e.g., indinavir, ritonavir) and zidovudine -certain blood pressure medications such as benazepril, captopril, enalapril, fosinopril,  lisinopril, moexipril, monopril, perindopril, quinapril, ramipril, trandolapril -certain cancer medications such as anthracyclines (e.g., daunorubicin, doxorubicin), busulfan, cytarabine, paclitaxel, pentostatin, tamoxifen, trastuzumab -certain diuretics such as chlorothiazide, chlorthalidone, hydrochlorothiazide, indapamide, metolazone -certain medicines that treat or prevent blood clots like warfarin -certain muscle relaxants such as succinylcholine -cyclosporine -etanercept -indomethacin -medicines to increase blood counts like filgrastim, pegfilgrastim, sargramostim -medicines used as general anesthesia -metronidazole -natalizumab This list may not describe all possible interactions. Give your health care provider a list of all the medicines, herbs, non-prescription drugs, or dietary supplements you use. Also tell them if you smoke, drink alcohol, or use illegal drugs. Some items may interact with your medicine. What should I watch for while using this medicine? Visit your doctor for checks on your progress. This drug may make you feel generally unwell. This is not uncommon, as chemotherapy can affect healthy cells as well as cancer cells. Report any side effects. Continue your course of treatment even though you feel ill unless your doctor tells you to stop. Drink water or other fluids as directed. Urinate often, even at night. In some cases, you may be given additional medicines to help with side effects. Follow all directions for their use. Call your doctor or health care professional for advice if you get a fever, chills or sore throat, or other symptoms of a cold or flu. Do not treat yourself. This drug decreases your body's ability to fight infections. Try to avoid being around people who are sick. This medicine may increase your risk to bruise or bleed. Call your doctor or health care professional if you notice any unusual bleeding. Be careful brushing and flossing your teeth or using a  toothpick because you may get an infection or bleed   more easily. If you have any dental work done, tell your dentist you are receiving this medicine. You may get drowsy or dizzy. Do not drive, use machinery, or do anything that needs mental alertness until you know how this medicine affects you. Do not become pregnant while taking this medicine or for 1 year after stopping it. Women should inform their doctor if they wish to become pregnant or think they might be pregnant. Men should not father a child while taking this medicine and for 4 months after stopping it. There is a potential for serious side effects to an unborn child. Talk to your health care professional or pharmacist for more information. Do not breast-feed an infant while taking this medicine. This medicine may interfere with the ability to have a child. This medicine has caused ovarian failure in some women. This medicine has caused reduced sperm counts in some men. You should talk with your doctor or health care professional if you are concerned about your fertility. If you are going to have surgery, tell your doctor or health care professional that you have taken this medicine. What side effects may I notice from receiving this medicine? Side effects that you should report to your doctor or health care professional as soon as possible: -allergic reactions like skin rash, itching or hives, swelling of the face, lips, or tongue -low blood counts - this medicine may decrease the number of white blood cells, red blood cells and platelets. You may be at increased risk for infections and bleeding. -signs of infection - fever or chills, cough, sore throat, pain or difficulty passing urine -signs of decreased platelets or bleeding - bruising, pinpoint red spots on the skin, black, tarry stools, blood in the urine -signs of decreased red blood cells - unusually weak or tired, fainting spells, lightheadedness -breathing problems -dark  urine -dizziness -palpitations -swelling of the ankles, feet, hands -trouble passing urine or change in the amount of urine -weight gain -yellowing of the eyes or skin Side effects that usually do not require medical attention (report to your doctor or health care professional if they continue or are bothersome): -changes in nail or skin color -hair loss -missed menstrual periods -mouth sores -nausea, vomiting This list may not describe all possible side effects. Call your doctor for medical advice about side effects. You may report side effects to FDA at 1-800-FDA-1088. Where should I keep my medicine? This drug is given in a hospital or clinic and will not be stored at home. NOTE: This sheet is a summary. It may not cover all possible information. If you have questions about this medicine, talk to your doctor, pharmacist, or health care provider.  2018 Elsevier/Gold Standard (2012-06-08 16:22:58) Docetaxel injection What is this medicine? DOCETAXEL (doe se TAX el) is a chemotherapy drug. It targets fast dividing cells, like cancer cells, and causes these cells to die. This medicine is used to treat many types of cancers like breast cancer, certain stomach cancers, head and neck cancer, lung cancer, and prostate cancer. This medicine may be used for other purposes; ask your health care provider or pharmacist if you have questions. COMMON BRAND NAME(S): Docefrez, Taxotere What should I tell my health care provider before I take this medicine? They need to know if you have any of these conditions: -infection (especially a virus infection such as chickenpox, cold sores, or herpes) -liver disease -low blood counts, like low white cell, platelet, or red cell counts -an unusual or allergic reaction to docetaxel,   polysorbate 80, other chemotherapy agents, other medicines, foods, dyes, or preservatives -pregnant or trying to get pregnant -breast-feeding How should I use this medicine? This  drug is given as an infusion into a vein. It is administered in a hospital or clinic by a specially trained health care professional. Talk to your pediatrician regarding the use of this medicine in children. Special care may be needed. Overdosage: If you think you have taken too much of this medicine contact a poison control center or emergency room at once. NOTE: This medicine is only for you. Do not share this medicine with others. What if I miss a dose? It is important not to miss your dose. Call your doctor or health care professional if you are unable to keep an appointment. What may interact with this medicine? -cyclosporine -erythromycin -ketoconazole -medicines to increase blood counts like filgrastim, pegfilgrastim, sargramostim -vaccines Talk to your doctor or health care professional before taking any of these medicines: -acetaminophen -aspirin -ibuprofen -ketoprofen -naproxen This list may not describe all possible interactions. Give your health care provider a list of all the medicines, herbs, non-prescription drugs, or dietary supplements you use. Also tell them if you smoke, drink alcohol, or use illegal drugs. Some items may interact with your medicine. What should I watch for while using this medicine? Your condition will be monitored carefully while you are receiving this medicine. You will need important blood work done while you are taking this medicine. This drug may make you feel generally unwell. This is not uncommon, as chemotherapy can affect healthy cells as well as cancer cells. Report any side effects. Continue your course of treatment even though you feel ill unless your doctor tells you to stop. In some cases, you may be given additional medicines to help with side effects. Follow all directions for their use. Call your doctor or health care professional for advice if you get a fever, chills or sore throat, or other symptoms of a cold or flu. Do not treat  yourself. This drug decreases your body's ability to fight infections. Try to avoid being around people who are sick. This medicine may increase your risk to bruise or bleed. Call your doctor or health care professional if you notice any unusual bleeding. This medicine may contain alcohol in the product. You may get drowsy or dizzy. Do not drive, use machinery, or do anything that needs mental alertness until you know how this medicine affects you. Do not stand or sit up quickly, especially if you are an older patient. This reduces the risk of dizzy or fainting spells. Avoid alcoholic drinks. Do not become pregnant while taking this medicine. Women should inform their doctor if they wish to become pregnant or think they might be pregnant. There is a potential for serious side effects to an unborn child. Talk to your health care professional or pharmacist for more information. Do not breast-feed an infant while taking this medicine. What side effects may I notice from receiving this medicine? Side effects that you should report to your doctor or health care professional as soon as possible: -allergic reactions like skin rash, itching or hives, swelling of the face, lips, or tongue -low blood counts - This drug may decrease the number of white blood cells, red blood cells and platelets. You may be at increased risk for infections and bleeding. -signs of infection - fever or chills, cough, sore throat, pain or difficulty passing urine -signs of decreased platelets or bleeding - bruising, pinpoint red spots   on the skin, black, tarry stools, nosebleeds -signs of decreased red blood cells - unusually weak or tired, fainting spells, lightheadedness -breathing problems -fast or irregular heartbeat -low blood pressure -mouth sores -nausea and vomiting -pain, swelling, redness or irritation at the injection site -pain, tingling, numbness in the hands or feet -swelling of the ankle, feet, hands -weight  gain Side effects that usually do not require medical attention (report to your doctor or health care professional if they continue or are bothersome): -bone pain -complete hair loss including hair on your head, underarms, pubic hair, eyebrows, and eyelashes -diarrhea -excessive tearing -changes in the color of fingernails -loosening of the fingernails -nausea -muscle pain -red flush to skin -sweating -weak or tired This list may not describe all possible side effects. Call your doctor for medical advice about side effects. You may report side effects to FDA at 1-800-FDA-1088. Where should I keep my medicine? This drug is given in a hospital or clinic and will not be stored at home. NOTE: This sheet is a summary. It may not cover all possible information. If you have questions about this medicine, talk to your doctor, pharmacist, or health care provider.  2018 Elsevier/Gold Standard (2015-08-27 12:32:56) Pertuzumab injection What is this medicine? PERTUZUMAB (per TOOZ ue mab) is a monoclonal antibody. It is used to treat breast cancer. This medicine may be used for other purposes; ask your health care provider or pharmacist if you have questions. COMMON BRAND NAME(S): PERJETA What should I tell my health care provider before I take this medicine? They need to know if you have any of these conditions: -heart disease -heart failure -high blood pressure -history of irregular heart beat -recent or ongoing radiation therapy -an unusual or allergic reaction to pertuzumab, other medicines, foods, dyes, or preservatives -pregnant or trying to get pregnant -breast-feeding How should I use this medicine? This medicine is for infusion into a vein. It is given by a health care professional in a hospital or clinic setting. Talk to your pediatrician regarding the use of this medicine in children. Special care may be needed. Overdosage: If you think you have taken too much of this medicine  contact a poison control center or emergency room at once. NOTE: This medicine is only for you. Do not share this medicine with others. What if I miss a dose? It is important not to miss your dose. Call your doctor or health care professional if you are unable to keep an appointment. What may interact with this medicine? Interactions are not expected. Give your health care provider a list of all the medicines, herbs, non-prescription drugs, or dietary supplements you use. Also tell them if you smoke, drink alcohol, or use illegal drugs. Some items may interact with your medicine. This list may not describe all possible interactions. Give your health care provider a list of all the medicines, herbs, non-prescription drugs, or dietary supplements you use. Also tell them if you smoke, drink alcohol, or use illegal drugs. Some items may interact with your medicine. What should I watch for while using this medicine? Your condition will be monitored carefully while you are receiving this medicine. Report any side effects. Continue your course of treatment even though you feel ill unless your doctor tells you to stop. Do not become pregnant while taking this medicine or for 7 months after stopping it. Women should inform their doctor if they wish to become pregnant or think they might be pregnant. Women of child-bearing potential will need to  have a negative pregnancy test before starting this medicine. There is a potential for serious side effects to an unborn child. Talk to your health care professional or pharmacist for more information. Do not breast-feed an infant while taking this medicine or for 7 months after stopping it. Women must use effective birth control with this medicine. Call your doctor or health care professional for advice if you get a fever, chills or sore throat, or other symptoms of a cold or flu. Do not treat yourself. Try to avoid being around people who are sick. You may experience  fever, chills, and headache during the infusion. Report any side effects during the infusion to your health care professional. What side effects may I notice from receiving this medicine? Side effects that you should report to your doctor or health care professional as soon as possible: -breathing problems -chest pain or palpitations -dizziness -feeling faint or lightheaded -fever or chills -skin rash, itching or hives -sore throat -swelling of the face, lips, or tongue -swelling of the legs or ankles -unusually weak or tired Side effects that usually do not require medical attention (report to your doctor or health care professional if they continue or are bothersome): -diarrhea -hair loss -nausea, vomiting -tiredness This list may not describe all possible side effects. Call your doctor for medical advice about side effects. You may report side effects to FDA at 1-800-FDA-1088. Where should I keep my medicine? This drug is given in a hospital or clinic and will not be stored at home. NOTE: This sheet is a summary. It may not cover all possible information. If you have questions about this medicine, talk to your doctor, pharmacist, or health care provider.  2018 Elsevier/Gold Standard (2015-08-27 12:08:50) Trastuzumab injection for infusion What is this medicine? TRASTUZUMAB (tras TOO zoo mab) is a monoclonal antibody. It is used to treat breast cancer and stomach cancer. This medicine may be used for other purposes; ask your health care provider or pharmacist if you have questions. COMMON BRAND NAME(S): Herceptin What should I tell my health care provider before I take this medicine? They need to know if you have any of these conditions: -heart disease -heart failure -lung or breathing disease, like asthma -an unusual or allergic reaction to trastuzumab, benzyl alcohol, or other medications, foods, dyes, or preservatives -pregnant or trying to get pregnant -breast-feeding How  should I use this medicine? This drug is given as an infusion into a vein. It is administered in a hospital or clinic by a specially trained health care professional. Talk to your pediatrician regarding the use of this medicine in children. This medicine is not approved for use in children. Overdosage: If you think you have taken too much of this medicine contact a poison control center or emergency room at once. NOTE: This medicine is only for you. Do not share this medicine with others. What if I miss a dose? It is important not to miss a dose. Call your doctor or health care professional if you are unable to keep an appointment. What may interact with this medicine? This medicine may interact with the following medications: -certain types of chemotherapy, such as daunorubicin, doxorubicin, epirubicin, and idarubicin This list may not describe all possible interactions. Give your health care provider a list of all the medicines, herbs, non-prescription drugs, or dietary supplements you use. Also tell them if you smoke, drink alcohol, or use illegal drugs. Some items may interact with your medicine. What should I watch for while using  this medicine? Visit your doctor for checks on your progress. Report any side effects. Continue your course of treatment even though you feel ill unless your doctor tells you to stop. Call your doctor or health care professional for advice if you get a fever, chills or sore throat, or other symptoms of a cold or flu. Do not treat yourself. Try to avoid being around people who are sick. You may experience fever, chills and shaking during your first infusion. These effects are usually mild and can be treated with other medicines. Report any side effects during the infusion to your health care professional. Fever and chills usually do not happen with later infusions. Do not become pregnant while taking this medicine or for 7 months after stopping it. Women should inform  their doctor if they wish to become pregnant or think they might be pregnant. Women of child-bearing potential will need to have a negative pregnancy test before starting this medicine. There is a potential for serious side effects to an unborn child. Talk to your health care professional or pharmacist for more information. Do not breast-feed an infant while taking this medicine or for 7 months after stopping it. Women must use effective birth control with this medicine. What side effects may I notice from receiving this medicine? Side effects that you should report to your doctor or health care professional as soon as possible: -allergic reactions like skin rash, itching or hives, swelling of the face, lips, or tongue -chest pain or palpitations -cough -dizziness -feeling faint or lightheaded, falls -fever -general ill feeling or flu-like symptoms -signs of worsening heart failure like breathing problems; swelling in your legs and feet -unusually weak or tired Side effects that usually do not require medical attention (report to your doctor or health care professional if they continue or are bothersome): -bone pain -changes in taste -diarrhea -joint pain -nausea/vomiting -weight loss This list may not describe all possible side effects. Call your doctor for medical advice about side effects. You may report side effects to FDA at 1-800-FDA-1088. Where should I keep my medicine? This drug is given in a hospital or clinic and will not be stored at home. NOTE: This sheet is a summary. It may not cover all possible information. If you have questions about this medicine, talk to your doctor, pharmacist, or health care provider.  2018 Elsevier/Gold Standard (2016-07-19 14:37:52)

## 2017-08-04 ENCOUNTER — Ambulatory Visit: Payer: 59

## 2017-08-23 ENCOUNTER — Other Ambulatory Visit: Payer: Self-pay | Admitting: *Deleted

## 2017-08-23 ENCOUNTER — Inpatient Hospital Stay: Payer: 59 | Attending: Hematology & Oncology | Admitting: Hematology & Oncology

## 2017-08-23 ENCOUNTER — Other Ambulatory Visit: Payer: Self-pay

## 2017-08-23 ENCOUNTER — Inpatient Hospital Stay: Payer: 59

## 2017-08-23 ENCOUNTER — Encounter: Payer: Self-pay | Admitting: Hematology & Oncology

## 2017-08-23 DIAGNOSIS — Z5189 Encounter for other specified aftercare: Secondary | ICD-10-CM | POA: Diagnosis not present

## 2017-08-23 DIAGNOSIS — Z170 Estrogen receptor positive status [ER+]: Secondary | ICD-10-CM

## 2017-08-23 DIAGNOSIS — Z5112 Encounter for antineoplastic immunotherapy: Secondary | ICD-10-CM | POA: Insufficient documentation

## 2017-08-23 DIAGNOSIS — Z803 Family history of malignant neoplasm of breast: Secondary | ICD-10-CM

## 2017-08-23 DIAGNOSIS — Z5111 Encounter for antineoplastic chemotherapy: Secondary | ICD-10-CM | POA: Diagnosis present

## 2017-08-23 DIAGNOSIS — C50911 Malignant neoplasm of unspecified site of right female breast: Secondary | ICD-10-CM

## 2017-08-23 DIAGNOSIS — C50511 Malignant neoplasm of lower-outer quadrant of right female breast: Secondary | ICD-10-CM

## 2017-08-23 LAB — CBC WITH DIFFERENTIAL (CANCER CENTER ONLY)
Basophils Absolute: 0 10*3/uL (ref 0.0–0.1)
Basophils Relative: 0 %
EOS ABS: 0 10*3/uL (ref 0.0–0.5)
EOS PCT: 0 %
HCT: 29 % — ABNORMAL LOW (ref 34.8–46.6)
HEMOGLOBIN: 9.6 g/dL — AB (ref 11.6–15.9)
LYMPHS ABS: 1.2 10*3/uL (ref 0.9–3.3)
Lymphocytes Relative: 19 %
MCH: 29.3 pg (ref 26.0–34.0)
MCHC: 33.1 g/dL (ref 32.0–36.0)
MCV: 88.4 fL (ref 81.0–101.0)
Monocytes Absolute: 0.2 10*3/uL (ref 0.1–0.9)
Monocytes Relative: 4 %
NEUTROS PCT: 77 %
Neutro Abs: 5.2 10*3/uL (ref 1.5–6.5)
PLATELETS: 310 10*3/uL (ref 145–400)
RBC: 3.28 MIL/uL — AB (ref 3.70–5.32)
RDW: 14.9 % (ref 11.1–15.7)
WBC: 6.6 10*3/uL (ref 3.9–10.3)

## 2017-08-23 LAB — CMP (CANCER CENTER ONLY)
ALBUMIN: 3.5 g/dL (ref 3.5–5.0)
ALK PHOS: 48 U/L (ref 26–84)
ALT: 15 U/L (ref 0–55)
AST: 20 U/L (ref 5–34)
Anion gap: 14 (ref 5–15)
BILIRUBIN TOTAL: 0.5 mg/dL (ref 0.2–1.2)
BUN: 13 mg/dL (ref 7–22)
CALCIUM: 9.9 mg/dL (ref 8.0–10.3)
CHLORIDE: 102 mmol/L (ref 98–108)
CO2: 25 mmol/L (ref 18–33)
CREATININE: 0.6 mg/dL (ref 0.60–1.10)
Glucose, Bld: 133 mg/dL — ABNORMAL HIGH (ref 73–118)
Potassium: 3.9 mmol/L (ref 3.5–5.1)
SODIUM: 141 mmol/L (ref 128–145)
TOTAL PROTEIN: 7 g/dL (ref 6.4–8.1)

## 2017-08-23 MED ORDER — SODIUM CHLORIDE 0.9 % IV SOLN
Freq: Once | INTRAVENOUS | Status: AC
  Administered 2017-08-23: 10:00:00 via INTRAVENOUS

## 2017-08-23 MED ORDER — PEGFILGRASTIM 6 MG/0.6ML ~~LOC~~ PSKT
PREFILLED_SYRINGE | SUBCUTANEOUS | Status: AC
  Filled 2017-08-23: qty 0.6

## 2017-08-23 MED ORDER — DIPHENHYDRAMINE HCL 25 MG PO CAPS
ORAL_CAPSULE | ORAL | Status: AC
  Filled 2017-08-23: qty 2

## 2017-08-23 MED ORDER — ACETAMINOPHEN 325 MG PO TABS
ORAL_TABLET | ORAL | Status: AC
  Filled 2017-08-23: qty 2

## 2017-08-23 MED ORDER — DEXAMETHASONE SODIUM PHOSPHATE 10 MG/ML IJ SOLN
INTRAMUSCULAR | Status: AC
  Filled 2017-08-23: qty 1

## 2017-08-23 MED ORDER — PALONOSETRON HCL INJECTION 0.25 MG/5ML
INTRAVENOUS | Status: AC
  Filled 2017-08-23: qty 5

## 2017-08-23 MED ORDER — PEGFILGRASTIM 6 MG/0.6ML ~~LOC~~ PSKT
6.0000 mg | PREFILLED_SYRINGE | Freq: Once | SUBCUTANEOUS | Status: AC
  Administered 2017-08-23: 6 mg via SUBCUTANEOUS

## 2017-08-23 MED ORDER — LORAZEPAM 0.5 MG PO TABS: 0.5000 mg | ORAL_TABLET | Freq: Four times a day (QID) | ORAL | 0 refills | Status: DC | PRN

## 2017-08-23 MED ORDER — DIPHENHYDRAMINE HCL 25 MG PO CAPS
50.0000 mg | ORAL_CAPSULE | Freq: Once | ORAL | Status: AC
  Administered 2017-08-23: 50 mg via ORAL

## 2017-08-23 MED ORDER — DOCETAXEL CHEMO INJECTION 160 MG/16ML
60.7500 mg/m2 | Freq: Once | INTRAVENOUS | Status: AC
  Administered 2017-08-23: 90 mg via INTRAVENOUS
  Filled 2017-08-23: qty 9

## 2017-08-23 MED ORDER — SUVOREXANT 10 MG PO TABS: 10.0000 mg | ORAL_TABLET | Freq: Every evening | ORAL | 0 refills | Status: DC | PRN

## 2017-08-23 MED ORDER — SODIUM CHLORIDE 0.9% FLUSH
10.0000 mL | INTRAVENOUS | Status: DC | PRN
  Administered 2017-08-23: 10 mL
  Filled 2017-08-23: qty 10

## 2017-08-23 MED ORDER — HEPARIN SOD (PORK) LOCK FLUSH 100 UNIT/ML IV SOLN
500.0000 [IU] | Freq: Once | INTRAVENOUS | Status: AC | PRN
  Administered 2017-08-23: 500 [IU]
  Filled 2017-08-23: qty 5

## 2017-08-23 MED ORDER — TRASTUZUMAB CHEMO 150 MG IV SOLR
300.0000 mg | Freq: Once | INTRAVENOUS | Status: AC
  Administered 2017-08-23: 300 mg via INTRAVENOUS
  Filled 2017-08-23: qty 14.29

## 2017-08-23 MED ORDER — DEXAMETHASONE SODIUM PHOSPHATE 10 MG/ML IJ SOLN
10.0000 mg | Freq: Once | INTRAMUSCULAR | Status: AC
  Administered 2017-08-23: 10 mg via INTRAVENOUS

## 2017-08-23 MED ORDER — PALONOSETRON HCL INJECTION 0.25 MG/5ML
0.2500 mg | Freq: Once | INTRAVENOUS | Status: AC
  Administered 2017-08-23: 0.25 mg via INTRAVENOUS

## 2017-08-23 MED ORDER — SODIUM CHLORIDE 0.9 % IV SOLN
420.0000 mg | Freq: Once | INTRAVENOUS | Status: AC
  Administered 2017-08-23: 420 mg via INTRAVENOUS
  Filled 2017-08-23: qty 14

## 2017-08-23 MED ORDER — SODIUM CHLORIDE 0.9 % IV SOLN
540.0000 mg/m2 | Freq: Once | INTRAVENOUS | Status: AC
  Administered 2017-08-23: 820 mg via INTRAVENOUS
  Filled 2017-08-23: qty 41

## 2017-08-23 MED ORDER — ACETAMINOPHEN 325 MG PO TABS
650.0000 mg | ORAL_TABLET | Freq: Once | ORAL | Status: AC
  Administered 2017-08-23: 650 mg via ORAL

## 2017-08-23 NOTE — Patient Instructions (Signed)
[facility] Discharge Instructions for Patients Receiving Chemotherapy  Today you received the following chemotherapy agents Herceptin, Perjeta, Taxotere, Cytoxan.  To help prevent nausea and vomiting after your treatment, we encourage you to take your nausea medication per your MD instructions.   If you develop nausea and vomiting that is not controlled by your nausea medication, call the clinic.   BELOW ARE SYMPTOMS THAT SHOULD BE REPORTED IMMEDIATELY:  *FEVER GREATER THAN 100.5 F  *CHILLS WITH OR WITHOUT FEVER  NAUSEA AND VOMITING THAT IS NOT CONTROLLED WITH YOUR NAUSEA MEDICATION  *UNUSUAL SHORTNESS OF BREATH  *UNUSUAL BRUISING OR BLEEDING  TENDERNESS IN MOUTH AND THROAT WITH OR WITHOUT PRESENCE OF ULCERS  *URINARY PROBLEMS  *BOWEL PROBLEMS  UNUSUAL RASH Items with * indicate a potential emergency and should be followed up as soon as possible.  Feel free to call the clinic should you have any questions or concerns. The clinic phone number is [phone].  Please show the [facility] at check-in to the Emergency Department and triage nurse.

## 2017-08-23 NOTE — Progress Notes (Signed)
Hematology and Oncology Follow Up Visit  Deanna Reynolds [id number] 1951-07-11 67 y.o. 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  She is doing okay.  We decrease the dose of Taxotere a little bit after her third cycle.  This helped with respect to her diarrhea.  She is on a little bit of Lomotil.  This is helped.  This will be her final cycle of Taxotere/Cytoxan.  She has done nicely.  I had a long talk with her as to how we need to proceed.  She will clearly need radiation therapy.  She had a lumpectomy on the right side.  She will need adjuvant radiation therapy.  She lives in [place].  It will be a lot easier for her to be seen at the [facility] regional radiation therapy center.  We will make a phone call to the radiation oncologist at the center.  She has HER-2 positive disease.  She has a small tumor.  She is lymph node negative.  I think that she probably would benefit from 6 more months of Herceptin/Perjeta.  She has ER positive disease.  She will need aromatase inhibitor therapy.  I will start her on this once radiation is completed.  She did have a nice New Year's.  She has had no cough.  She has had no fever.  She has had no bleeding.  There is been no leg swelling.  She has had no rashes.  Her echocardiogram was last done in November 2018.  She had an ejection fraction of 55-60%.  I do not think we have to do another one until March.  Overall, her performance status is ECOG 1.   Medications:  Current Outpatient Medications:  .  Ascorbic Acid (VITAMIN C) 1000 MG tablet, Take 1,000 mg by mouth daily., Disp: , Rfl:  .  Cholecalciferol (VITAMIN D-3) 5000 units TABS, Take 1 tablet by mouth daily., Disp: , Rfl:  .  Cyanocobalamin (VITAMIN B-12 PO), Take 1 tablet by mouth daily., Disp: , Rfl:  .   Flaxseed, Linseed, (FLAX SEED OIL PO), Take by mouth. One teaspoon daily, Disp: , Rfl:  .  hydrochlorothiazide (HYDRODIURIL) 25 MG tablet, 25 mg., Disp: , Rfl:  .  lidocaine-prilocaine (EMLA) cream, Apply 1 application topically as needed., Disp: 30 g, Rfl: 0 .  losartan (COZAAR) 100 MG tablet, , Disp: , Rfl:  .  MAGNESIUM PO, Take 1 tablet by mouth daily., Disp: , Rfl:  .  VITAMIN E PO, Take 1 tablet by mouth daily., Disp: , Rfl:  .  HYDROcodone-acetaminophen (NORCO/VICODIN) 5-325 MG tablet, Take 1-2 tablets by mouth every 6 (six) hours as needed for moderate pain. (Patient not taking: Reported on 05/30/2017), Disp: 30 tablet, Rfl: 0 .  HYDROcodone-acetaminophen (NORCO/VICODIN) 5-325 MG tablet, Take 1 tablet by mouth every 4 (four) hours as needed for moderate pain. (Patient not taking: Reported on 06/12/2017), Disp: 30 tablet, Rfl: 0 .  zolpidem (AMBIEN CR) 6.25 MG CR tablet, Take 1 tablet (6.25 mg total) by mouth at bedtime as needed for sleep. (Patient not taking: Reported on 05/30/2017), Disp: 20 tablet, Rfl: 0  Allergies:  Allergies  Allergen Reactions  . Motrin [Ibuprofen] Nausea And Vomiting    Past Medical History, Surgical history, Social history, and Family History were reviewed and updated.  Review of Systems: Review of Systems  Constitutional: Negative for appetite change, fatigue, fever and unexpected weight change.  HENT:   Negative for lump/mass, mouth sores, sore throat and trouble swallowing.   Respiratory: Negative for cough, hemoptysis and shortness of breath.   Cardiovascular: Negative for leg swelling and palpitations.  Gastrointestinal: Negative for abdominal distention, abdominal pain, blood in stool, constipation, diarrhea, nausea and vomiting.  Genitourinary: Negative for bladder incontinence, dysuria, frequency and hematuria.   Musculoskeletal: Negative for arthralgias, back pain, gait problem and myalgias.  Skin: Negative for itching and rash.  Neurological:  Negative for dizziness, extremity weakness, gait problem, headaches, numbness, seizures and speech difficulty.  Hematological: Does not bruise/bleed easily.  Psychiatric/Behavioral: Negative for depression and sleep disturbance. The patient is not nervous/anxious.     Physical Exam:  weight is 114 lb (51.7 kg). Her oral temperature is 98.6 F (37 C). Her blood pressure is 136/82 and her pulse is 77. Her respiration is 20.   Wt Readings from Last 3 Encounters:  06/12/17 114 lb (51.7 kg)  05/30/17 115 lb 9.6 oz (52.4 kg)  05/16/17 114 lb 3.2 oz (51.8 kg)   Physical Exam  Constitutional: She is oriented to person, place, and time.  HENT:  Head: Normocephalic and atraumatic.  Mouth/Throat: Oropharynx is clear and moist.  Eyes: EOM are normal. Pupils are equal, round, and reactive to light.  Neck: Normal range of motion.  Cardiovascular: Normal rate, regular rhythm and normal heart sounds.  Pulmonary/Chest: Effort normal and breath sounds normal.  Abdominal: Soft. Bowel sounds are normal.  Musculoskeletal: Normal range of motion. She exhibits no edema, tenderness or deformity.  Lymphadenopathy:    She has no cervical adenopathy.  Neurological: She is alert and oriented to person, place, and time.  Skin: Skin is warm and dry. No rash noted. No erythema.  Psychiatric: She has a normal mood and affect. Her behavior is normal. Judgment and thought content normal.  Vitals reviewed.    Lab Results  Component Value Date   WBC 6.8 06/12/2017   HGB 11.7 06/12/2017   HCT 35.0 06/12/2017   MCV 90 06/12/2017   PLT 228 06/12/2017     Chemistry      Component Value Date/Time   NA 138 04/21/2017 1204   NA 137 11/02/2016 1421   K 3.7 04/21/2017 1204   K 3.6 11/02/2016 1421   CL 105 04/21/2017 1204   CL 101 11/02/2016 1421   CO2 25 04/21/2017 1204   CO2 28 11/02/2016 1421   BUN 10 04/21/2017 1204   BUN 11 11/02/2016 1421   CREATININE 0.86 04/21/2017 1204   CREATININE 0.63 11/02/2016  1421      Component Value Date/Time   CALCIUM 9.5 04/21/2017 1204   CALCIUM 9.4 11/02/2016 1421   ALKPHOS 57 11/02/2016 1421   AST 14 11/02/2016 1421   ALT 10 11/02/2016 1421   BILITOT 0.3 11/02/2016 1421         Impression and Plan: Ms. Copen is a 67 year old postmenopausal African-American female with a newly diagnosed stage IA infiltrating ductal carcinoma of the right breast.  She has an incredible family history of breast cancer.  I would have to believe that she will have a breast cancer gene.  She will complete her chemotherapy today.  I believe 4 cycles of Taxotere/Cytoxan are appropriate.  We will plan to get her back in about a month.  She will likely start radiation therapy when we see her back.  She will  continue on Herceptin/Perjeta.  Again, after radiation is completed, we will proceed with Femara.  She is quite petite, we will have to order a bone density test on her so we can see how her bones are.  She likely will need Prolia.  I spent about 30 minutes with her today talking to her about our continued plans of therapy.  I answered her questions.  She is very appreciative of [place] being able to take the time to explain our treatment program.    Volanda Napoleon, MD 11/5/20184:32 PM

## 2017-08-25 ENCOUNTER — Inpatient Hospital Stay: Payer: 59

## 2017-08-30 ENCOUNTER — Other Ambulatory Visit (HOSPITAL_BASED_OUTPATIENT_CLINIC_OR_DEPARTMENT_OTHER): Payer: 59

## 2017-09-06 ENCOUNTER — Other Ambulatory Visit (HOSPITAL_BASED_OUTPATIENT_CLINIC_OR_DEPARTMENT_OTHER): Payer: 59

## 2017-09-13 ENCOUNTER — Other Ambulatory Visit: Payer: 59

## 2017-09-13 ENCOUNTER — Ambulatory Visit: Payer: 59

## 2017-09-13 ENCOUNTER — Ambulatory Visit: Payer: 59 | Admitting: Hematology & Oncology

## 2017-09-15 ENCOUNTER — Ambulatory Visit: Payer: 59

## 2017-09-19 ENCOUNTER — Telehealth: Payer: Self-pay | Admitting: *Deleted

## 2017-09-19 NOTE — Telephone Encounter (Signed)
Patient states her Port is "bent towards the front of my neck". She states for the last few days the site has been irritated but this morning she looked and it appears that the tubing to the port is bent towards the front of her neck. She denies swelling or other symptoms. Patient has an appointment tomorrow morning.   Spoke with [facility] and we will assess the site tomorrow during her appointment.   Patient is aware.

## 2017-09-20 ENCOUNTER — Inpatient Hospital Stay: Payer: 59

## 2017-09-20 ENCOUNTER — Encounter: Payer: Self-pay | Admitting: Hematology & Oncology

## 2017-09-20 ENCOUNTER — Ambulatory Visit (HOSPITAL_BASED_OUTPATIENT_CLINIC_OR_DEPARTMENT_OTHER)
Admission: RE | Admit: 2017-09-20 | Discharge: 2017-09-20 | Disposition: A | Discharge status: Home / Self Care | Payer: 59 | Source: Ambulatory Visit | Attending: Hematology & Oncology | Admitting: Hematology & Oncology

## 2017-09-20 ENCOUNTER — Other Ambulatory Visit: Payer: Self-pay

## 2017-09-20 ENCOUNTER — Inpatient Hospital Stay: Payer: 59 | Attending: Hematology & Oncology | Admitting: Hematology & Oncology

## 2017-09-20 VITALS — BP 148/85 | HR 83 | Temp 98.2°F | Resp 18 | Wt 117.0 lb

## 2017-09-20 DIAGNOSIS — Z170 Estrogen receptor positive status [ER+]: Secondary | ICD-10-CM | POA: Insufficient documentation

## 2017-09-20 DIAGNOSIS — D649 Anemia, unspecified: Secondary | ICD-10-CM

## 2017-09-20 DIAGNOSIS — C50911 Malignant neoplasm of unspecified site of right female breast: Secondary | ICD-10-CM | POA: Diagnosis present

## 2017-09-20 DIAGNOSIS — C50511 Malignant neoplasm of lower-outer quadrant of right female breast: Secondary | ICD-10-CM | POA: Insufficient documentation

## 2017-09-20 DIAGNOSIS — Z5112 Encounter for antineoplastic immunotherapy: Secondary | ICD-10-CM | POA: Insufficient documentation

## 2017-09-20 DIAGNOSIS — M85852 Other specified disorders of bone density and structure, left thigh: Secondary | ICD-10-CM | POA: Diagnosis not present

## 2017-09-20 DIAGNOSIS — D508 Other iron deficiency anemias: Secondary | ICD-10-CM

## 2017-09-20 DIAGNOSIS — M8588 Other specified disorders of bone density and structure, other site: Secondary | ICD-10-CM | POA: Insufficient documentation

## 2017-09-20 LAB — CBC WITH DIFFERENTIAL (CANCER CENTER ONLY)
BASOS PCT: 0 %
Basophils Absolute: 0 10*3/uL (ref 0.0–0.1)
Eosinophils Absolute: 0.2 10*3/uL (ref 0.0–0.5)
Eosinophils Relative: 4 %
HEMATOCRIT: 27 % — AB (ref 34.8–46.6)
Hemoglobin: 8.9 g/dL — ABNORMAL LOW (ref 11.6–15.9)
LYMPHS ABS: 1.5 10*3/uL (ref 0.9–3.3)
Lymphocytes Relative: 31 %
MCH: 30 pg (ref 26.0–34.0)
MCHC: 33 g/dL (ref 32.0–36.0)
MCV: 90.9 fL (ref 81.0–101.0)
MONOS PCT: 11 %
Monocytes Absolute: 0.6 10*3/uL (ref 0.1–0.9)
Neutro Abs: 2.6 10*3/uL (ref 1.5–6.5)
Neutrophils Relative %: 54 %
Platelet Count: 296 10*3/uL (ref 145–400)
RBC: 2.97 MIL/uL — ABNORMAL LOW (ref 3.70–5.32)
RDW: 16.2 % — AB (ref 11.1–15.7)
WBC Count: 4.8 10*3/uL (ref 3.9–10.0)

## 2017-09-20 LAB — CMP (CANCER CENTER ONLY)
ALBUMIN: 3 g/dL — AB (ref 3.5–5.0)
ALT: 30 U/L (ref 10–47)
AST: 28 U/L (ref 11–38)
Alkaline Phosphatase: 53 U/L (ref 26–84)
Anion gap: 8 (ref 5–15)
BUN: 8 mg/dL (ref 7–22)
CALCIUM: 9.5 mg/dL (ref 8.0–10.3)
CO2: 27 mmol/L (ref 18–33)
CREATININE: 0.7 mg/dL (ref 0.60–1.20)
Chloride: 108 mmol/L (ref 98–108)
Glucose, Bld: 99 mg/dL (ref 73–118)
Potassium: 3.4 mmol/L (ref 3.3–4.7)
SODIUM: 143 mmol/L (ref 128–145)
Total Bilirubin: 0.7 mg/dL (ref 0.2–1.6)
Total Protein: 6.5 g/dL (ref 6.4–8.1)

## 2017-09-20 MED ORDER — ACETAMINOPHEN 325 MG PO TABS
ORAL_TABLET | ORAL | Status: AC
  Filled 2017-09-20: qty 2

## 2017-09-20 MED ORDER — HEPARIN SOD (PORK) LOCK FLUSH 100 UNIT/ML IV SOLN
500.0000 [IU] | Freq: Once | INTRAVENOUS | Status: AC | PRN
  Administered 2017-09-20: 500 [IU]
  Filled 2017-09-20: qty 5

## 2017-09-20 MED ORDER — DIPHENHYDRAMINE HCL 25 MG PO CAPS
ORAL_CAPSULE | ORAL | Status: AC
  Filled 2017-09-20: qty 2

## 2017-09-20 MED ORDER — ACETAMINOPHEN 325 MG PO TABS
650.0000 mg | ORAL_TABLET | Freq: Once | ORAL | Status: AC
  Administered 2017-09-20: 650 mg via ORAL

## 2017-09-20 MED ORDER — SODIUM CHLORIDE 0.9 % IV SOLN
420.0000 mg | Freq: Once | INTRAVENOUS | Status: AC
  Administered 2017-09-20: 420 mg via INTRAVENOUS
  Filled 2017-09-20: qty 14

## 2017-09-20 MED ORDER — SODIUM CHLORIDE 0.9% FLUSH
10.0000 mL | INTRAVENOUS | Status: DC | PRN
  Administered 2017-09-20: 10 mL
  Filled 2017-09-20: qty 10

## 2017-09-20 MED ORDER — SODIUM CHLORIDE 0.9 % IV SOLN
Freq: Once | INTRAVENOUS | Status: AC
  Administered 2017-09-20: 11:00:00 via INTRAVENOUS

## 2017-09-20 MED ORDER — TRASTUZUMAB CHEMO 150 MG IV SOLR
300.0000 mg | Freq: Once | INTRAVENOUS | Status: AC
  Administered 2017-09-20: 300 mg via INTRAVENOUS
  Filled 2017-09-20: qty 14.29

## 2017-09-20 MED ORDER — DIPHENHYDRAMINE HCL 25 MG PO CAPS
50.0000 mg | ORAL_CAPSULE | Freq: Once | ORAL | Status: AC
  Administered 2017-09-20: 50 mg via ORAL

## 2017-09-20 NOTE — Patient Instructions (Signed)

## 2017-09-20 NOTE — Patient Instructions (Signed)
[facility] Discharge Instructions for Patients Receiving Chemotherapy  Today you received the following chemotherapy agents:  Herceptin and Perjeta  To help prevent nausea and vomiting after your treatment, we encourage you to take your nausea medication as ordered per MD.    If you develop nausea and vomiting that is not controlled by your nausea medication, call the clinic.   BELOW ARE SYMPTOMS THAT SHOULD BE REPORTED IMMEDIATELY:  *FEVER GREATER THAN 100.5 F  *CHILLS WITH OR WITHOUT FEVER  NAUSEA AND VOMITING THAT IS NOT CONTROLLED WITH YOUR NAUSEA MEDICATION  *UNUSUAL SHORTNESS OF BREATH  *UNUSUAL BRUISING OR BLEEDING  TENDERNESS IN MOUTH AND THROAT WITH OR WITHOUT PRESENCE OF ULCERS  *URINARY PROBLEMS  *BOWEL PROBLEMS  UNUSUAL RASH Items with * indicate a potential emergency and should be followed up as soon as possible.  Feel free to call the clinic should you have any questions or concerns. The clinic phone number is [phone].  Please show the CHEMO ALERT CARD at check-in to the Emergency Department and triage nurse.   

## 2017-09-20 NOTE — Progress Notes (Signed)
Hematology and Oncology Follow Up Visit  Tyleah Loh [id number] [birth year] 67 y.o. 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 Trager is back for follow-up.  She is completed her systemic chemotherapy.  She had 4 cycles of Taxotere/  Cytoxan.  She had this along with Herceptin/Perjeta.  We will now just continue her on Herceptin/Perjeta until November 2019.  She is already seen radiation oncology.  She will start her radiation therapy to the right breast on Friday.  She was complaining of her Port-A-Cath.  I talked to her about this.  She is doing okay right now.  She still is not sleeping all that much.  I am not sure what else we could try to help with her sleep.  She has had no mouth sores.  She is had no diarrhea.  She has had no rashes.  She is had no leg swelling.  There is been no nausea or vomiting.  Overall, her performance status is ECOG 1.  She is due for a echocardiogram.  We will get one set up for her in about 1 or 2 weeks.  Medications:  Current Outpatient Medications:  .  Ascorbic Acid (VITAMIN C) 1000 MG tablet, Take 1,000 mg by mouth daily., Disp: , Rfl:  .  Cholecalciferol (VITAMIN D-3) 5000 units TABS, Take 1 tablet by mouth daily., Disp: , Rfl:  .  Cyanocobalamin (VITAMIN B-12 PO), Take 1 tablet by mouth daily., Disp: , Rfl:  .  Flaxseed, Linseed, (FLAX SEED OIL PO), Take by mouth. One teaspoon daily, Disp: , Rfl:  .  hydrochlorothiazide (HYDRODIURIL) 25 MG tablet, 25 mg., Disp: , Rfl:  .  lidocaine-prilocaine (EMLA) cream, Apply 1 application topically as needed., Disp: 30 g, Rfl: 0 .  losartan (COZAAR) 100 MG tablet, , Disp: , Rfl:  .  MAGNESIUM PO, Take 1 tablet by mouth daily., Disp: , Rfl:  .  VITAMIN E PO, Take 1 tablet by mouth  daily., Disp: , Rfl:  .  HYDROcodone-acetaminophen (NORCO/VICODIN) 5-325 MG tablet, Take 1-2 tablets by mouth every 6 (six) hours as needed for moderate pain. (Patient not taking: Reported on 05/30/2017), Disp: 30 tablet, Rfl: 0 .  HYDROcodone-acetaminophen (NORCO/VICODIN) 5-325 MG tablet, Take 1 tablet by mouth every 4 (four) hours as needed for moderate pain. (Patient not taking: Reported on 06/12/2017), Disp: 30 tablet, Rfl: 0 .  zolpidem (AMBIEN CR) 6.25 MG CR tablet, Take 1 tablet (6.25 mg total) by mouth at bedtime as needed for sleep. (Patient not taking: Reported on 05/30/2017), Disp: 20 tablet, Rfl: 0  Allergies:  Allergies  Allergen Reactions  . Motrin [Ibuprofen] Nausea And Vomiting    Past Medical History, Surgical history, Social history, and Family History were reviewed and updated.  Review of Systems: Review of Systems  Constitutional: Negative for appetite change, fatigue, fever and unexpected weight change.  HENT:   Negative for lump/mass, mouth sores, sore throat and trouble swallowing.   Respiratory: Negative for cough, hemoptysis and shortness of breath.   Cardiovascular: Negative for leg swelling and palpitations.  Gastrointestinal: Negative for abdominal distention, abdominal pain, blood in stool, constipation, diarrhea, nausea and vomiting.  Genitourinary: Negative for bladder incontinence, dysuria, frequency and hematuria.   Musculoskeletal: Negative for arthralgias, back pain, gait problem and myalgias.  Skin: Negative  for itching and rash.  Neurological: Negative for dizziness, extremity weakness, gait problem, headaches, numbness, seizures and speech difficulty.  Hematological: Does not bruise/bleed easily.  Psychiatric/Behavioral: Negative for depression and sleep disturbance. The patient is not nervous/anxious.     Physical Exam:  weight is 114 lb (51.7 kg). Her oral temperature is 98.6 F (37 C). Her blood pressure is 136/82 and her pulse is 77. Her  respiration is 20.   Wt Readings from Last 3 Encounters:  06/12/17 114 lb (51.7 kg)  05/30/17 115 lb 9.6 oz (52.4 kg)  05/16/17 114 lb 3.2 oz (51.8 kg)   Physical Exam  Constitutional: She is oriented to person, place, and time.  HENT:  Head: Normocephalic and atraumatic.  Mouth/Throat: Oropharynx is clear and moist.  Eyes: EOM are normal. Pupils are equal, round, and reactive to light.  Neck: Normal range of motion.  Cardiovascular: Normal rate, regular rhythm and normal heart sounds.  Pulmonary/Chest: Effort normal and breath sounds normal.  Abdominal: Soft. Bowel sounds are normal.  Musculoskeletal: Normal range of motion. She exhibits no edema, tenderness or deformity.  Lymphadenopathy:    She has no cervical adenopathy.  Neurological: She is alert and oriented to person, place, and time.  Skin: Skin is warm and dry. No rash noted. No erythema.  Psychiatric: She has a normal mood and affect. Her behavior is normal. Judgment and thought content normal.  Vitals reviewed.    Lab Results  Component Value Date   WBC 6.8 06/12/2017   HGB 11.7 06/12/2017   HCT 35.0 06/12/2017   MCV 90 06/12/2017   PLT 228 06/12/2017     Chemistry      Component Value Date/Time   NA 138 04/21/2017 1204   NA 137 11/02/2016 1421   K 3.7 04/21/2017 1204   K 3.6 11/02/2016 1421   CL 105 04/21/2017 1204   CL 101 11/02/2016 1421   CO2 25 04/21/2017 1204   CO2 28 11/02/2016 1421   BUN 10 04/21/2017 1204   BUN 11 11/02/2016 1421   CREATININE 0.86 04/21/2017 1204   CREATININE 0.63 11/02/2016 1421      Component Value Date/Time   CALCIUM 9.5 04/21/2017 1204   CALCIUM 9.4 11/02/2016 1421   ALKPHOS 57 11/02/2016 1421   AST 14 11/02/2016 1421   ALT 10 11/02/2016 1421   BILITOT 0.3 11/02/2016 1421         Impression and Plan: Ms. Robarge is a 66-year-old postmenopausal African-American female with a newly diagnosed stage IA infiltrating ductal carcinoma of the right breast.  She has an  incredible family history of breast cancer.  I would have to believe that she will have a breast cancer gene.  She will does not wish to have the genetic test.  We will now proceed with maintenance Herceptin/Perjeta.  She will have radiation therapy.  We will get her on Femara after she completes her radiation.  I am just glad that she is feeling a little bit better.  She is a little anemic.  We will have to watch this.  I will have her come back in 3 weeks.  I will have to talk to her about using Prolia.  I am sure that she has an element of osteopenia or osteoporosis.  She will have a bone density test done soon.    ENNEVER,PETER R, MD 11/5/20184:32 PM 

## 2017-09-21 ENCOUNTER — Telehealth: Payer: Self-pay | Admitting: *Deleted

## 2017-09-21 NOTE — Telephone Encounter (Signed)
-----   Message from Volanda Napoleon, MD sent at 09/20/2017  5:08 PM EST ----- Call - you have osteopenia!!  NO osteoporosis.  We will use a bone hardener to help this!!  Laurey Arrow

## 2017-10-09 ENCOUNTER — Ambulatory Visit (HOSPITAL_BASED_OUTPATIENT_CLINIC_OR_DEPARTMENT_OTHER)
Admission: RE | Admit: 2017-10-09 | Discharge: 2017-10-09 | Disposition: A | Discharge status: Home / Self Care | Payer: 59 | Source: Ambulatory Visit | Attending: Internal Medicine | Admitting: Internal Medicine

## 2017-10-09 ENCOUNTER — Telehealth: Payer: Self-pay | Admitting: *Deleted

## 2017-10-09 DIAGNOSIS — I5189 Other ill-defined heart diseases: Secondary | ICD-10-CM | POA: Diagnosis not present

## 2017-10-09 DIAGNOSIS — I517 Cardiomegaly: Secondary | ICD-10-CM | POA: Insufficient documentation

## 2017-10-09 DIAGNOSIS — C50511 Malignant neoplasm of lower-outer quadrant of right female breast: Secondary | ICD-10-CM | POA: Insufficient documentation

## 2017-10-09 DIAGNOSIS — Z170 Estrogen receptor positive status [ER+]: Secondary | ICD-10-CM

## 2017-10-09 DIAGNOSIS — I340 Nonrheumatic mitral (valve) insufficiency: Secondary | ICD-10-CM | POA: Diagnosis not present

## 2017-10-09 NOTE — Telephone Encounter (Addendum)
Patient aware of results  ----- Message from Volanda Napoleon, MD sent at 10/09/2017 12:26 PM EST ----- Call - your heart is pumping well!!  Laurey Arrow

## 2017-10-09 NOTE — Progress Notes (Signed)
Echocardiogram 2D Echocardiogram limited exam has been performed.  Joelene Millin 10/09/2017, 9:10 AM

## 2017-10-10 ENCOUNTER — Other Ambulatory Visit: Payer: Self-pay | Admitting: *Deleted

## 2017-10-10 DIAGNOSIS — C50511 Malignant neoplasm of lower-outer quadrant of right female breast: Secondary | ICD-10-CM

## 2017-10-10 DIAGNOSIS — Z170 Estrogen receptor positive status [ER+]: Principal | ICD-10-CM

## 2017-10-11 ENCOUNTER — Other Ambulatory Visit: Payer: Self-pay | Admitting: Family

## 2017-10-11 ENCOUNTER — Inpatient Hospital Stay: Payer: 59

## 2017-10-11 ENCOUNTER — Inpatient Hospital Stay: Payer: 59 | Attending: Hematology & Oncology

## 2017-10-11 ENCOUNTER — Other Ambulatory Visit: Payer: Self-pay

## 2017-10-11 ENCOUNTER — Other Ambulatory Visit: Payer: Self-pay | Admitting: *Deleted

## 2017-10-11 VITALS — BP 145/68 | HR 87 | Temp 97.7°F | Resp 18

## 2017-10-11 DIAGNOSIS — C50911 Malignant neoplasm of unspecified site of right female breast: Secondary | ICD-10-CM | POA: Diagnosis present

## 2017-10-11 DIAGNOSIS — Z170 Estrogen receptor positive status [ER+]: Principal | ICD-10-CM

## 2017-10-11 DIAGNOSIS — C50511 Malignant neoplasm of lower-outer quadrant of right female breast: Secondary | ICD-10-CM

## 2017-10-11 DIAGNOSIS — M8580 Other specified disorders of bone density and structure, unspecified site: Secondary | ICD-10-CM | POA: Insufficient documentation

## 2017-10-11 DIAGNOSIS — Z5112 Encounter for antineoplastic immunotherapy: Secondary | ICD-10-CM | POA: Insufficient documentation

## 2017-10-11 DIAGNOSIS — M8588 Other specified disorders of bone density and structure, other site: Secondary | ICD-10-CM

## 2017-10-11 LAB — CBC WITH DIFFERENTIAL (CANCER CENTER ONLY)
BASOS ABS: 0 10*3/uL (ref 0.0–0.1)
BASOS PCT: 1 %
EOS ABS: 0.2 10*3/uL (ref 0.0–0.5)
EOS PCT: 4 %
HCT: 28.3 % — ABNORMAL LOW (ref 34.8–46.6)
Hemoglobin: 9.2 g/dL — ABNORMAL LOW (ref 11.6–15.9)
Lymphocytes Relative: 35 %
Lymphs Abs: 1.5 10*3/uL (ref 0.9–3.3)
MCH: 29.6 pg (ref 26.0–34.0)
MCHC: 32.5 g/dL (ref 32.0–36.0)
MCV: 91 fL (ref 81.0–101.0)
MONO ABS: 0.4 10*3/uL (ref 0.1–0.9)
Monocytes Relative: 10 %
Neutro Abs: 2.1 10*3/uL (ref 1.5–6.5)
Neutrophils Relative %: 50 %
PLATELETS: 221 10*3/uL (ref 145–400)
RBC: 3.11 MIL/uL — ABNORMAL LOW (ref 3.70–5.32)
RDW: 14.3 % (ref 11.1–15.7)
WBC: 4.1 10*3/uL (ref 3.9–10.0)

## 2017-10-11 LAB — CMP (CANCER CENTER ONLY)
ALK PHOS: 53 U/L (ref 26–84)
ALT: 23 U/L (ref 10–47)
AST: 31 U/L (ref 11–38)
Albumin: 3.2 g/dL — ABNORMAL LOW (ref 3.5–5.0)
Anion gap: 3 — ABNORMAL LOW (ref 5–15)
BUN: 12 mg/dL (ref 7–22)
CALCIUM: 8.9 mg/dL (ref 8.0–10.3)
CO2: 30 mmol/L (ref 18–33)
CREATININE: 0.7 mg/dL (ref 0.60–1.20)
Chloride: 105 mmol/L (ref 98–108)
GLUCOSE: 94 mg/dL (ref 73–118)
Potassium: 3.2 mmol/L — ABNORMAL LOW (ref 3.3–4.7)
SODIUM: 138 mmol/L (ref 128–145)
TOTAL PROTEIN: 6.3 g/dL — AB (ref 6.4–8.1)
Total Bilirubin: 0.5 mg/dL (ref 0.2–1.6)

## 2017-10-11 MED ORDER — DENOSUMAB 120 MG/1.7ML ~~LOC~~ SOLN
SUBCUTANEOUS | Status: AC
  Filled 2017-10-11: qty 1.7

## 2017-10-11 MED ORDER — MORPHINE SULFATE (PF) 4 MG/ML IV SOLN
INTRAVENOUS | Status: AC
  Filled 2017-10-11: qty 1

## 2017-10-11 MED ORDER — ACETAMINOPHEN 325 MG PO TABS
ORAL_TABLET | ORAL | Status: AC
  Filled 2017-10-11: qty 2

## 2017-10-11 MED ORDER — LORAZEPAM 0.5 MG PO TABS: 0.5000 mg | ORAL_TABLET | Freq: Four times a day (QID) | ORAL | 0 refills | Status: DC | PRN

## 2017-10-11 MED ORDER — DIPHENHYDRAMINE HCL 25 MG PO CAPS
ORAL_CAPSULE | ORAL | Status: AC
  Filled 2017-10-11: qty 2

## 2017-10-11 MED ORDER — MORPHINE SULFATE 4 MG/ML IJ SOLN
2.0000 mg | Freq: Once | INTRAMUSCULAR | Status: AC
Start: 2017-10-11 — End: 2017-10-11
  Administered 2017-10-11: 2 mg via INTRAVENOUS
  Filled 2017-10-11: qty 1

## 2017-10-11 MED ORDER — SODIUM CHLORIDE 0.9 % IV SOLN
Freq: Once | INTRAVENOUS | Status: AC
  Administered 2017-10-11: 09:00:00 via INTRAVENOUS

## 2017-10-11 MED ORDER — HEPARIN SOD (PORK) LOCK FLUSH 100 UNIT/ML IV SOLN
500.0000 [IU] | Freq: Once | INTRAVENOUS | Status: AC | PRN
  Administered 2017-10-11: 500 [IU]
  Filled 2017-10-11: qty 5

## 2017-10-11 MED ORDER — ZOLPIDEM TARTRATE ER 6.25 MG PO TBCR: 6.2500 mg | EXTENDED_RELEASE_TABLET | Freq: Every evening | ORAL | 0 refills | Status: DC | PRN

## 2017-10-11 MED ORDER — SODIUM CHLORIDE 0.9 % IV SOLN
420.0000 mg | Freq: Once | INTRAVENOUS | Status: AC
  Administered 2017-10-11: 420 mg via INTRAVENOUS
  Filled 2017-10-11: qty 14

## 2017-10-11 MED ORDER — DENOSUMAB 60 MG/ML ~~LOC~~ SOLN
60.0000 mg | Freq: Once | SUBCUTANEOUS | Status: AC
  Administered 2017-10-11: 60 mg via SUBCUTANEOUS
  Filled 2017-10-11: qty 1

## 2017-10-11 MED ORDER — SODIUM CHLORIDE 0.9% FLUSH
10.0000 mL | INTRAVENOUS | Status: DC | PRN
  Administered 2017-10-11: 10 mL
  Filled 2017-10-11: qty 10

## 2017-10-11 MED ORDER — HYDROCODONE-ACETAMINOPHEN 5-325 MG PO TABS: 1.0000 | ORAL_TABLET | Freq: Four times a day (QID) | ORAL | 0 refills | Status: AC | PRN

## 2017-10-11 MED ORDER — TRASTUZUMAB CHEMO 150 MG IV SOLR
300.0000 mg | Freq: Once | INTRAVENOUS | Status: AC
  Administered 2017-10-11: 300 mg via INTRAVENOUS
  Filled 2017-10-11: qty 14.29

## 2017-10-11 MED ORDER — DIPHENHYDRAMINE HCL 25 MG PO CAPS
50.0000 mg | ORAL_CAPSULE | Freq: Once | ORAL | Status: AC
  Administered 2017-10-11: 50 mg via ORAL

## 2017-10-11 MED ORDER — ACETAMINOPHEN 325 MG PO TABS
650.0000 mg | ORAL_TABLET | Freq: Once | ORAL | Status: AC
  Administered 2017-10-11: 650 mg via ORAL

## 2017-10-11 NOTE — Patient Instructions (Signed)
[facility] Discharge Instructions for Patients Receiving Chemotherapy  Today you received the following chemotherapy agents Herceptin/Perjeta.  To help prevent nausea and vomiting after your treatment, we encourage you to take your nausea medication as indicated by your MD.   If you develop nausea and vomiting that is not controlled by your nausea medication, call the clinic.   BELOW ARE SYMPTOMS THAT SHOULD BE REPORTED IMMEDIATELY:  *FEVER GREATER THAN 100.5 F  *CHILLS WITH OR WITHOUT FEVER  NAUSEA AND VOMITING THAT IS NOT CONTROLLED WITH YOUR NAUSEA MEDICATION  *UNUSUAL SHORTNESS OF BREATH  *UNUSUAL BRUISING OR BLEEDING  TENDERNESS IN MOUTH AND THROAT WITH OR WITHOUT PRESENCE OF ULCERS  *URINARY PROBLEMS  *BOWEL PROBLEMS  UNUSUAL RASH Items with * indicate a potential emergency and should be followed up as soon as possible.  Feel free to call the clinic should you have any questions or concerns. The clinic phone number is [phone].  Please show the [facility] at check-in to the Emergency Department and triage nurse.

## 2017-11-01 ENCOUNTER — Other Ambulatory Visit: Payer: Self-pay | Admitting: *Deleted

## 2017-11-01 ENCOUNTER — Inpatient Hospital Stay: Payer: 59

## 2017-11-01 ENCOUNTER — Inpatient Hospital Stay (HOSPITAL_BASED_OUTPATIENT_CLINIC_OR_DEPARTMENT_OTHER): Payer: 59 | Admitting: Hematology & Oncology

## 2017-11-01 ENCOUNTER — Other Ambulatory Visit: Payer: Self-pay

## 2017-11-01 VITALS — BP 147/70 | HR 70 | Temp 98.1°F | Resp 16 | Wt 116.0 lb

## 2017-11-01 DIAGNOSIS — C50511 Malignant neoplasm of lower-outer quadrant of right female breast: Secondary | ICD-10-CM

## 2017-11-01 DIAGNOSIS — Z170 Estrogen receptor positive status [ER+]: Secondary | ICD-10-CM | POA: Diagnosis not present

## 2017-11-01 DIAGNOSIS — Z5112 Encounter for antineoplastic immunotherapy: Secondary | ICD-10-CM | POA: Diagnosis not present

## 2017-11-01 DIAGNOSIS — C50911 Malignant neoplasm of unspecified site of right female breast: Secondary | ICD-10-CM | POA: Diagnosis not present

## 2017-11-01 DIAGNOSIS — D508 Other iron deficiency anemias: Secondary | ICD-10-CM

## 2017-11-01 LAB — CBC WITH DIFFERENTIAL (CANCER CENTER ONLY)
BASOS ABS: 0 10*3/uL (ref 0.0–0.1)
BASOS PCT: 0 %
EOS PCT: 3 %
Eosinophils Absolute: 0.1 10*3/uL (ref 0.0–0.5)
HCT: 32.5 % — ABNORMAL LOW (ref 34.8–46.6)
HEMOGLOBIN: 10.7 g/dL — AB (ref 11.6–15.9)
Lymphocytes Relative: 41 %
Lymphs Abs: 1.7 10*3/uL (ref 0.9–3.3)
MCH: 29.6 pg (ref 26.0–34.0)
MCHC: 32.9 g/dL (ref 32.0–36.0)
MCV: 90 fL (ref 81.0–101.0)
Monocytes Absolute: 0.4 10*3/uL (ref 0.1–0.9)
Monocytes Relative: 10 %
NEUTROS PCT: 46 %
Neutro Abs: 1.9 10*3/uL (ref 1.5–6.5)
PLATELETS: 201 10*3/uL (ref 145–400)
RBC: 3.61 MIL/uL — AB (ref 3.70–5.32)
RDW: 13.6 % (ref 11.1–15.7)
WBC: 4.1 10*3/uL (ref 3.9–10.0)

## 2017-11-01 LAB — CMP (CANCER CENTER ONLY)
ALBUMIN: 3.3 g/dL — AB (ref 3.5–5.0)
ALK PHOS: 64 U/L (ref 26–84)
ALT: 25 U/L (ref 10–47)
AST: 32 U/L (ref 11–38)
Anion gap: 4 — ABNORMAL LOW (ref 5–15)
BILIRUBIN TOTAL: 0.4 mg/dL (ref 0.2–1.6)
BUN: 10 mg/dL (ref 7–22)
CALCIUM: 8.9 mg/dL (ref 8.0–10.3)
CHLORIDE: 107 mmol/L (ref 98–108)
CO2: 28 mmol/L (ref 18–33)
CREATININE: 0.9 mg/dL (ref 0.60–1.20)
Glucose, Bld: 103 mg/dL (ref 73–118)
Potassium: 3.4 mmol/L (ref 3.3–4.7)
Sodium: 139 mmol/L (ref 128–145)
TOTAL PROTEIN: 6.9 g/dL (ref 6.4–8.1)

## 2017-11-01 LAB — IRON AND TIBC
Iron: 53 ug/dL (ref 41–142)
SATURATION RATIOS: 16 % — AB (ref 21–57)
TIBC: 329 ug/dL (ref 236–444)
UIBC: 275 ug/dL

## 2017-11-01 LAB — FERRITIN: Ferritin: 16 ng/mL (ref 9–269)

## 2017-11-01 MED ORDER — HEPARIN SOD (PORK) LOCK FLUSH 100 UNIT/ML IV SOLN
500.0000 [IU] | Freq: Once | INTRAVENOUS | Status: AC | PRN
  Administered 2017-11-01: 500 [IU]
  Filled 2017-11-01: qty 5

## 2017-11-01 MED ORDER — ACETAMINOPHEN 325 MG PO TABS
650.0000 mg | ORAL_TABLET | Freq: Once | ORAL | Status: AC
  Administered 2017-11-01: 650 mg via ORAL

## 2017-11-01 MED ORDER — SODIUM CHLORIDE 0.9 % IV SOLN
420.0000 mg | Freq: Once | INTRAVENOUS | Status: AC
  Administered 2017-11-01: 420 mg via INTRAVENOUS
  Filled 2017-11-01: qty 14

## 2017-11-01 MED ORDER — LETROZOLE 2.5 MG PO TABS: 2.5000 mg | ORAL_TABLET | Freq: Every day | ORAL | 12 refills | Status: AC

## 2017-11-01 MED ORDER — ACETAMINOPHEN 325 MG PO TABS
ORAL_TABLET | ORAL | Status: AC
  Filled 2017-11-01: qty 2

## 2017-11-01 MED ORDER — SODIUM CHLORIDE 0.9 % IV SOLN
Freq: Once | INTRAVENOUS | Status: AC
  Administered 2017-11-01: 10:00:00 via INTRAVENOUS

## 2017-11-01 MED ORDER — TEMAZEPAM 7.5 MG PO CAPS: 7.5000 mg | ORAL_CAPSULE | Freq: Every evening | ORAL | 0 refills | Status: DC | PRN

## 2017-11-01 MED ORDER — DIPHENHYDRAMINE HCL 25 MG PO CAPS
50.0000 mg | ORAL_CAPSULE | Freq: Once | ORAL | Status: AC
  Administered 2017-11-01: 50 mg via ORAL

## 2017-11-01 MED ORDER — TEMAZEPAM 15 MG PO CAPS: 15.0000 mg | ORAL_CAPSULE | Freq: Every evening | ORAL | 2 refills | Status: DC | PRN

## 2017-11-01 MED ORDER — DIPHENHYDRAMINE HCL 25 MG PO CAPS
ORAL_CAPSULE | ORAL | Status: AC
  Filled 2017-11-01: qty 2

## 2017-11-01 MED ORDER — TRASTUZUMAB CHEMO 150 MG IV SOLR
300.0000 mg | Freq: Once | INTRAVENOUS | Status: AC
  Administered 2017-11-01: 300 mg via INTRAVENOUS
  Filled 2017-11-01: qty 14.29

## 2017-11-01 MED ORDER — SODIUM CHLORIDE 0.9% FLUSH
10.0000 mL | INTRAVENOUS | Status: DC | PRN
  Administered 2017-11-01: 10 mL
  Filled 2017-11-01: qty 10

## 2017-11-01 NOTE — Patient Instructions (Signed)
[facility] Discharge Instructions for Patients Receiving Chemotherapy  Today you received the following chemotherapy agents:  Herceptin and Perjeta  To help prevent nausea and vomiting after your treatment, we encourage you to take your nausea medication as ordered per MD.    If you develop nausea and vomiting that is not controlled by your nausea medication, call the clinic.   BELOW ARE SYMPTOMS THAT SHOULD BE REPORTED IMMEDIATELY:  *FEVER GREATER THAN 100.5 F  *CHILLS WITH OR WITHOUT FEVER  NAUSEA AND VOMITING THAT IS NOT CONTROLLED WITH YOUR NAUSEA MEDICATION  *UNUSUAL SHORTNESS OF BREATH  *UNUSUAL BRUISING OR BLEEDING  TENDERNESS IN MOUTH AND THROAT WITH OR WITHOUT PRESENCE OF ULCERS  *URINARY PROBLEMS  *BOWEL PROBLEMS  UNUSUAL RASH Items with * indicate a potential emergency and should be followed up as soon as possible.  Feel free to call the clinic should you have any questions or concerns. The clinic phone number is [phone].  Please show the CHEMO ALERT CARD at check-in to the Emergency Department and triage nurse.   

## 2017-11-01 NOTE — Patient Instructions (Signed)

## 2017-11-01 NOTE — Progress Notes (Signed)
Hematology and Oncology Follow Up Visit  Deanna Reynolds [id number] 1950/10/01 67 y.o. 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  She is feeling tired today.  She just completed radiation therapy.  She got through this fairly well.  She says that  radiation just made her feel fatigued.  We did go ahead and do an echocardiogram on her back on March 4.  The echocardiogram showed good cardiac function.  Her ejection fraction was 55-60%.  We will now start her on Femara.  I talked to her about Femara.  She did have a bone density test done back in February.  She does have some osteopenia.  As such, I think that she would benefit from Prolia.  I will have to talk to her about this when we see her back.  She is already taking vitamin D.  She is not sleeping well.  I do have her on Belsomra.  This really is not helping.  I will try her on some Restoril.  Her appetite is doing okay.  She has had no change in bowel or bladder habits.  She is had no cough.  She is had no shortness of breath.  Overall, her performance status is ECOG 1.   Medications:  Current Outpatient Medications:  .  Ascorbic Acid (VITAMIN C) 1000 MG tablet, Take 1,000 mg by mouth daily., Disp: , Rfl:  .  Cholecalciferol (VITAMIN D-3) 5000 units TABS, Take 1 tablet by mouth daily., Disp: , Rfl:  .  Cyanocobalamin (VITAMIN B-12 PO), Take 1 tablet by mouth daily., Disp: , Rfl:  .  Flaxseed, Linseed, (FLAX SEED OIL PO), Take by mouth. One teaspoon daily, Disp: , Rfl:  .  hydrochlorothiazide (HYDRODIURIL) 25 MG tablet, 25 mg., Disp: , Rfl:  .  lidocaine-prilocaine (EMLA) cream, Apply 1  application topically as needed., Disp: 30 g, Rfl: 0 .  losartan (COZAAR) 100 MG tablet, , Disp: , Rfl:  .  MAGNESIUM PO, Take 1 tablet by mouth daily., Disp: , Rfl:  .  VITAMIN E PO, Take 1 tablet by mouth daily., Disp: , Rfl:  .  HYDROcodone-acetaminophen (NORCO/VICODIN) 5-325 MG tablet, Take 1-2 tablets by mouth every 6 (six) hours as needed for moderate pain. (Patient not taking: Reported on 05/30/2017), Disp: 30 tablet, Rfl: 0 .  HYDROcodone-acetaminophen (NORCO/VICODIN) 5-325 MG tablet, Take 1 tablet by mouth every 4 (four) hours as needed for moderate pain. (Patient not taking: Reported on 06/12/2017), Disp: 30 tablet, Rfl: 0 .  zolpidem (AMBIEN CR) 6.25 MG CR tablet, Take 1 tablet (6.25 mg total) by mouth at bedtime as needed for sleep. (Patient not taking: Reported on 05/30/2017), Disp: 20 tablet, Rfl: 0  Allergies:  Allergies  Allergen Reactions  . Motrin [Ibuprofen] Nausea And Vomiting    Past Medical History, Surgical history, Social history, and Family History were reviewed and updated.  Review of Systems: Review of Systems  Constitutional: Negative for appetite change, fatigue, fever and unexpected weight change.  HENT:   Negative for lump/mass, mouth sores, sore throat and trouble swallowing.   Respiratory: Negative for cough, hemoptysis and shortness of breath.   Cardiovascular: Negative for leg swelling  and palpitations.  Gastrointestinal: Negative for abdominal distention, abdominal pain, blood in stool, constipation, diarrhea, nausea and vomiting.  Genitourinary: Negative for bladder incontinence, dysuria, frequency and hematuria.   Musculoskeletal: Negative for arthralgias, back pain, gait problem and myalgias.  Skin: Negative for itching and rash.  Neurological: Negative for dizziness, extremity weakness, gait problem, headaches, numbness, seizures and speech difficulty.  Hematological: Does not bruise/bleed easily.  Psychiatric/Behavioral: Negative for depression and  sleep disturbance. The patient is not nervous/anxious.     Physical Exam:  weight is 114 lb (51.7 kg). Her oral temperature is 98.6 F (37 C). Her blood pressure is 136/82 and her pulse is 77. Her respiration is 20.   Wt Readings from Last 3 Encounters:  06/12/17 114 lb (51.7 kg)  05/30/17 115 lb 9.6 oz (52.4 kg)  05/16/17 114 lb 3.2 oz (51.8 kg)   Physical Exam  Constitutional: She is oriented to person, place, and time.  HENT:  Head: Normocephalic and atraumatic.  Mouth/Throat: Oropharynx is clear and moist.  Eyes: Pupils are equal, round, and reactive to light. EOM are normal.  Neck: Normal range of motion.  Cardiovascular: Normal rate, regular rhythm and normal heart sounds.  Pulmonary/Chest: Effort normal and breath sounds normal.  Abdominal: Soft. Bowel sounds are normal.  Musculoskeletal: Normal range of motion. She exhibits no edema, tenderness or deformity.  Lymphadenopathy:    She has no cervical adenopathy.  Neurological: She is alert and oriented to person, place, and time.  Skin: Skin is warm and dry. No rash noted. No erythema.  Psychiatric: She has a normal mood and affect. Her behavior is normal. Judgment and thought content normal.  Vitals reviewed.    Lab Results  Component Value Date   WBC 6.8 06/12/2017   HGB 11.7 06/12/2017   HCT 35.0 06/12/2017   MCV 90 06/12/2017   PLT 228 06/12/2017     Chemistry      Component Value Date/Time   NA 138 04/21/2017 1204   NA 137 11/02/2016 1421   K 3.7 04/21/2017 1204   K 3.6 11/02/2016 1421   CL 105 04/21/2017 1204   CL 101 11/02/2016 1421   CO2 25 04/21/2017 1204   CO2 28 11/02/2016 1421   BUN 10 04/21/2017 1204   BUN 11 11/02/2016 1421   CREATININE 0.86 04/21/2017 1204   CREATININE 0.63 11/02/2016 1421      Component Value Date/Time   CALCIUM 9.5 04/21/2017 1204   CALCIUM 9.4 11/02/2016 1421   ALKPHOS 57 11/02/2016 1421   AST 14 11/02/2016 1421   ALT 10 11/02/2016 1421   BILITOT 0.3 11/02/2016  1421         Impression and Plan: Deanna Reynolds is a 67 year old postmenopausal African-American female with a newly diagnosed stage IA infiltrating ductal carcinoma of the right breast.  She has an incredible family history of breast cancer.  I would have to believe that she will have a breast cancer gene.  She will does not wish to have the genetic test.  We already have her on maintenance Herceptin/Perjeta.  Hopefully, we can stop this after 9 months.  We will get her on  Femara.  I would like to see her back in another 3 weeks.  Volanda Napoleon, MD 11/5/20184:32 PM

## 2017-11-22 ENCOUNTER — Other Ambulatory Visit: Payer: Self-pay

## 2017-11-22 ENCOUNTER — Inpatient Hospital Stay: Payer: 59

## 2017-11-22 ENCOUNTER — Inpatient Hospital Stay: Payer: 59 | Attending: Hematology & Oncology | Admitting: Hematology & Oncology

## 2017-11-22 VITALS — BP 161/86 | HR 72 | Temp 98.1°F | Resp 18 | Wt 118.0 lb

## 2017-11-22 DIAGNOSIS — C50511 Malignant neoplasm of lower-outer quadrant of right female breast: Secondary | ICD-10-CM

## 2017-11-22 DIAGNOSIS — Z170 Estrogen receptor positive status [ER+]: Secondary | ICD-10-CM | POA: Insufficient documentation

## 2017-11-22 DIAGNOSIS — Z5112 Encounter for antineoplastic immunotherapy: Secondary | ICD-10-CM | POA: Insufficient documentation

## 2017-11-22 DIAGNOSIS — C50911 Malignant neoplasm of unspecified site of right female breast: Secondary | ICD-10-CM | POA: Diagnosis present

## 2017-11-22 DIAGNOSIS — Z79811 Long term (current) use of aromatase inhibitors: Secondary | ICD-10-CM | POA: Insufficient documentation

## 2017-11-22 DIAGNOSIS — D509 Iron deficiency anemia, unspecified: Secondary | ICD-10-CM

## 2017-11-22 LAB — CBC WITH DIFFERENTIAL (CANCER CENTER ONLY)
Basophils Absolute: 0 10*3/uL (ref 0.0–0.1)
Basophils Relative: 0 %
EOS ABS: 0.1 10*3/uL (ref 0.0–0.5)
EOS PCT: 3 %
HCT: 36.3 % (ref 34.8–46.6)
Hemoglobin: 12 g/dL (ref 11.6–15.9)
Lymphocytes Relative: 42 %
Lymphs Abs: 1.8 10*3/uL (ref 0.9–3.3)
MCH: 28.8 pg (ref 26.0–34.0)
MCHC: 33.1 g/dL (ref 32.0–36.0)
MCV: 87.3 fL (ref 81.0–101.0)
MONO ABS: 0.4 10*3/uL (ref 0.1–0.9)
Monocytes Relative: 8 %
Neutro Abs: 1.9 10*3/uL (ref 1.5–6.5)
Neutrophils Relative %: 47 %
PLATELETS: 217 10*3/uL (ref 145–400)
RBC: 4.16 MIL/uL (ref 3.70–5.32)
RDW: 13 % (ref 11.1–15.7)
WBC: 4.2 10*3/uL (ref 3.9–10.0)

## 2017-11-22 LAB — CMP (CANCER CENTER ONLY)
ALK PHOS: 62 U/L (ref 26–84)
ALT: 30 U/L (ref 10–47)
AST: 31 U/L (ref 11–38)
Albumin: 3.7 g/dL (ref 3.5–5.0)
Anion gap: 7 (ref 5–15)
BUN: 9 mg/dL (ref 7–22)
CALCIUM: 9 mg/dL (ref 8.0–10.3)
CO2: 28 mmol/L (ref 18–33)
CREATININE: 0.8 mg/dL (ref 0.60–1.20)
Chloride: 107 mmol/L (ref 98–108)
GLUCOSE: 104 mg/dL (ref 73–118)
Potassium: 3.7 mmol/L (ref 3.3–4.7)
SODIUM: 142 mmol/L (ref 128–145)
Total Bilirubin: 0.5 mg/dL (ref 0.2–1.6)
Total Protein: 7.6 g/dL (ref 6.4–8.1)

## 2017-11-22 MED ORDER — KETOROLAC TROMETHAMINE 15 MG/ML IJ SOLN
INTRAMUSCULAR | Status: AC
  Filled 2017-11-22: qty 2

## 2017-11-22 MED ORDER — SODIUM CHLORIDE 0.9% FLUSH
10.0000 mL | INTRAVENOUS | Status: DC | PRN
  Administered 2017-11-22: 10 mL
  Filled 2017-11-22: qty 10

## 2017-11-22 MED ORDER — HEPARIN SOD (PORK) LOCK FLUSH 100 UNIT/ML IV SOLN
500.0000 [IU] | Freq: Once | INTRAVENOUS | Status: AC | PRN
  Administered 2017-11-22: 500 [IU]
  Filled 2017-11-22: qty 5

## 2017-11-22 MED ORDER — SODIUM CHLORIDE 0.9 % IV SOLN
420.0000 mg | Freq: Once | INTRAVENOUS | Status: AC
  Administered 2017-11-22: 420 mg via INTRAVENOUS
  Filled 2017-11-22: qty 14

## 2017-11-22 MED ORDER — ACETAMINOPHEN 325 MG PO TABS
650.0000 mg | ORAL_TABLET | Freq: Once | ORAL | Status: AC
  Administered 2017-11-22: 650 mg via ORAL

## 2017-11-22 MED ORDER — SODIUM CHLORIDE 0.9 % IV SOLN
Freq: Once | INTRAVENOUS | Status: AC
  Administered 2017-11-22: 10:00:00 via INTRAVENOUS

## 2017-11-22 MED ORDER — ACETAMINOPHEN 325 MG PO TABS
ORAL_TABLET | ORAL | Status: AC
  Filled 2017-11-22: qty 2

## 2017-11-22 MED ORDER — SODIUM CHLORIDE 0.9 % IV SOLN
510.0000 mg | Freq: Once | INTRAVENOUS | Status: AC
  Administered 2017-11-22: 510 mg via INTRAVENOUS
  Filled 2017-11-22: qty 17

## 2017-11-22 MED ORDER — TRAMADOL HCL 50 MG PO TABS: 50.0000 mg | ORAL_TABLET | Freq: Four times a day (QID) | ORAL | 0 refills | Status: DC | PRN

## 2017-11-22 MED ORDER — KETOROLAC TROMETHAMINE 15 MG/ML IJ SOLN
30.0000 mg | Freq: Once | INTRAMUSCULAR | Status: AC
  Administered 2017-11-22: 30 mg via INTRAVENOUS
  Filled 2017-11-22: qty 2

## 2017-11-22 MED ORDER — DIPHENHYDRAMINE HCL 25 MG PO CAPS
ORAL_CAPSULE | ORAL | Status: AC
  Filled 2017-11-22: qty 2

## 2017-11-22 MED ORDER — TRASTUZUMAB CHEMO 150 MG IV SOLR
300.0000 mg | Freq: Once | INTRAVENOUS | Status: AC
  Administered 2017-11-22: 300 mg via INTRAVENOUS
  Filled 2017-11-22: qty 14.29

## 2017-11-22 MED ORDER — DIPHENHYDRAMINE HCL 25 MG PO CAPS
50.0000 mg | ORAL_CAPSULE | Freq: Once | ORAL | Status: AC
  Administered 2017-11-22: 50 mg via ORAL

## 2017-11-22 NOTE — Patient Instructions (Signed)
Pertuzumab injection What is this medicine? PERTUZUMAB (per TOOZ ue mab) is a monoclonal antibody. It is used to treat breast cancer. This medicine may be used for other purposes; ask your health care provider or pharmacist if you have questions. COMMON BRAND NAME(S): PERJETA What should I tell my health care provider before I take this medicine? They need to know if you have any of these conditions: -heart disease -heart failure -high blood pressure -history of irregular heart beat -recent or ongoing radiation therapy -an unusual or allergic reaction to pertuzumab, other medicines, foods, dyes, or preservatives -pregnant or trying to get pregnant -breast-feeding How should I use this medicine? This medicine is for infusion into a vein. It is given by a health care professional in a hospital or clinic setting. Talk to your pediatrician regarding the use of this medicine in children. Special care may be needed. Overdosage: If you think you have taken too much of this medicine contact a poison control center or emergency room at once. NOTE: This medicine is only for you. Do not share this medicine with others. What if I miss a dose? It is important not to miss your dose. Call your doctor or health care professional if you are unable to keep an appointment. What may interact with this medicine? Interactions are not expected. Give your health care provider a list of all the medicines, herbs, non-prescription drugs, or dietary supplements you use. Also tell them if you smoke, drink alcohol, or use illegal drugs. Some items may interact with your medicine. This list may not describe all possible interactions. Give your health care provider a list of all the medicines, herbs, non-prescription drugs, or dietary supplements you use. Also tell them if you smoke, drink alcohol, or use illegal drugs. Some items may interact with your medicine. What should I watch for while using this medicine? Your  condition will be monitored carefully while you are receiving this medicine. Report any side effects. Continue your course of treatment even though you feel ill unless your doctor tells you to stop. Do not become pregnant while taking this medicine or for 7 months after stopping it. Women should inform their doctor if they wish to become pregnant or think they might be pregnant. Women of child-bearing potential will need to have a negative pregnancy test before starting this medicine. There is a potential for serious side effects to an unborn child. Talk to your health care professional or pharmacist for more information. Do not breast-feed an infant while taking this medicine or for 7 months after stopping it. Women must use effective birth control with this medicine. Call your doctor or health care professional for advice if you get a fever, chills or sore throat, or other symptoms of a cold or flu. Do not treat yourself. Try to avoid being around people who are sick. You may experience fever, chills, and headache during the infusion. Report any side effects during the infusion to your health care professional. What side effects may I notice from receiving this medicine? Side effects that you should report to your doctor or health care professional as soon as possible: -breathing problems -chest pain or palpitations -dizziness -feeling faint or lightheaded -fever or chills -skin rash, itching or hives -sore throat -swelling of the face, lips, or tongue -swelling of the legs or ankles -unusually weak or tired Side effects that usually do not require medical attention (report to your doctor or health care professional if they continue or are bothersome): -diarrhea -hair   loss -nausea, vomiting -tiredness This list may not describe all possible side effects. Call your doctor for medical advice about side effects. You may report side effects to FDA at 1-800-FDA-1088. Where should I keep my  medicine? This drug is given in a hospital or clinic and will not be stored at home. NOTE: This sheet is a summary. It may not cover all possible information. If you have questions about this medicine, talk to your doctor, pharmacist, or health care provider.  2018 Elsevier/Gold Standard (2015-08-27 12:08:50) Trastuzumab injection for infusion What is this medicine? TRASTUZUMAB (tras TOO zoo mab) is a monoclonal antibody. It is used to treat breast cancer and stomach cancer. This medicine may be used for other purposes; ask your health care provider or pharmacist if you have questions. COMMON BRAND NAME(S): Herceptin What should I tell my health care provider before I take this medicine? They need to know if you have any of these conditions: -heart disease -heart failure -lung or breathing disease, like asthma -an unusual or allergic reaction to trastuzumab, benzyl alcohol, or other medications, foods, dyes, or preservatives -pregnant or trying to get pregnant -breast-feeding How should I use this medicine? This drug is given as an infusion into a vein. It is administered in a hospital or clinic by a specially trained health care professional. Talk to your pediatrician regarding the use of this medicine in children. This medicine is not approved for use in children. Overdosage: If you think you have taken too much of this medicine contact a poison control center or emergency room at once. NOTE: This medicine is only for you. Do not share this medicine with others. What if I miss a dose? It is important not to miss a dose. Call your doctor or health care professional if you are unable to keep an appointment. What may interact with this medicine? This medicine may interact with the following medications: -certain types of chemotherapy, such as daunorubicin, doxorubicin, epirubicin, and idarubicin This list may not describe all possible interactions. Give your health care provider a list of  all the medicines, herbs, non-prescription drugs, or dietary supplements you use. Also tell them if you smoke, drink alcohol, or use illegal drugs. Some items may interact with your medicine. What should I watch for while using this medicine? Visit your doctor for checks on your progress. Report any side effects. Continue your course of treatment even though you feel ill unless your doctor tells you to stop. Call your doctor or health care professional for advice if you get a fever, chills or sore throat, or other symptoms of a cold or flu. Do not treat yourself. Try to avoid being around people who are sick. You may experience fever, chills and shaking during your first infusion. These effects are usually mild and can be treated with other medicines. Report any side effects during the infusion to your health care professional. Fever and chills usually do not happen with later infusions. Do not become pregnant while taking this medicine or for 7 months after stopping it. Women should inform their doctor if they wish to become pregnant or think they might be pregnant. Women of child-bearing potential will need to have a negative pregnancy test before starting this medicine. There is a potential for serious side effects to an unborn child. Talk to your health care professional or pharmacist for more information. Do not breast-feed an infant while taking this medicine or for 7 months after stopping it. Women must use effective birth control   with this medicine. What side effects may I notice from receiving this medicine? Side effects that you should report to your doctor or health care professional as soon as possible: -allergic reactions like skin rash, itching or hives, swelling of the face, lips, or tongue -chest pain or palpitations -cough -dizziness -feeling faint or lightheaded, falls -fever -general ill feeling or flu-like symptoms -signs of worsening heart failure like breathing problems;  swelling in your legs and feet -unusually weak or tired Side effects that usually do not require medical attention (report to your doctor or health care professional if they continue or are bothersome): -bone pain -changes in taste -diarrhea -joint pain -nausea/vomiting -weight loss This list may not describe all possible side effects. Call your doctor for medical advice about side effects. You may report side effects to FDA at 1-800-FDA-1088. Where should I keep my medicine? This drug is given in a hospital or clinic and will not be stored at home. NOTE: This sheet is a summary. It may not cover all possible information. If you have questions about this medicine, talk to your doctor, pharmacist, or health care provider.  2018 Elsevier/Gold Standard (2016-07-19 14:37:52)  

## 2017-11-22 NOTE — Progress Notes (Signed)
Hematology and Oncology Follow Up Visit  Deanna Reynolds [id number] 1950-11-06 67 y.o. 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  She actually comes in crying.  She went into the bathroom and had an episode of emesis.  She says that she is been having a lot of right breast pain for the past 2 weeks.  I am not sure why she did not let u[place]ore[place]now about this.  She has not seen a sPsychologist, sport and exercisefor this.  Her graph she completed her breast radiation about a month ago.  It is possible that this breast pain is from radiation.  I looked at the right breast.  It is not swollen.  It does not feel warm.  It is slightly hyperpigmented from radiation.  I do not see any erythema.  There is no right axillary adenopathy.  I am not sure exactly why she is having all the pain.  It might be some type of breast causalgia from the radiation which I would think would improve over time.  Otherwise, she seems to be holding her own.  She is looking forward to Deanna Reynolds.  She is doing well on Femara.  So far, there is been no evidence of toxicity from the Herceptin/Perjeta.  She has had no fever.  There is been no diarrhea.  Overall, her performance status is ECOG 1.  6  Medications:  Current Outpatient Medications:  .  Ascorbic Acid (VITAMIN C) 1000 MG tablet, Take 1,000 mg by mouth daily., Disp: , Rfl:  .  Cholecalciferol (VITAMIN D-3) 5000 units TABS, Take 1 tablet by mouth daily., Disp: , Rfl:  .  Cyanocobalamin (VITAMIN B-12 PO), Take 1 tablet by mouth daily., Disp: , Rfl:  .  Flaxseed, Linseed, (FLAX SEED OIL PO), Take by mouth. One teaspoon daily, Disp: , Rfl:  .   hydrochlorothiazide (HYDRODIURIL) 25 MG tablet, 25 mg., Disp: , Rfl:  .  lidocaine-prilocaine (EMLA) cream, Apply 1 application topically as needed., Disp: 30 g, Rfl: 0 .  losartan (COZAAR) 100 MG tablet, , Disp: , Rfl:  .  MAGNESIUM PO, Take 1 tablet by mouth daily., Disp: , Rfl:  .  VITAMIN E PO, Take 1 tablet by mouth daily., Disp: , Rfl:  .  HYDROcodone-acetaminophen (NORCO/VICODIN) 5-325 MG tablet, Take 1-2 tablets by mouth every 6 (six) hours as needed for moderate pain. (Patient not taking: Reported on 05/30/2017), Disp: 30 tablet, Rfl: 0 .  HYDROcodone-acetaminophen (NORCO/VICODIN) 5-325 MG tablet, Take 1 tablet by mouth every 4 (four) hours as needed for moderate pain. (Patient not taking: Reported on 06/12/2017), Disp: 30 tablet, Rfl: 0 .  zolpidem (AMBIEN CR) 6.25 MG CR tablet, Take 1 tablet (6.25 mg total) by mouth at bedtime as needed for sleep. (Patient not taking: Reported on 05/30/2017), Disp: 20 tablet, Rfl: 0  Allergies:  Allergies  Allergen Reactions  . Motrin [Ibuprofen] Nausea And Vomiting    Past Medical History, Surgical history, Social history, and Family History were reviewed and updated.  Review of Systems: Review of Systems  Constitutional: Negative for appetite change, fatigue, fever and unexpected weight change.  HENT:   Negative for lump/mass, mouth sores,  sore throat and trouble swallowing.   Respiratory: Negative for cough, hemoptysis and shortness of breath.   Cardiovascular: Negative for leg swelling and palpitations.  Gastrointestinal: Negative for abdominal distention, abdominal pain, blood in stool, constipation, diarrhea, nausea and vomiting.  Genitourinary: Negative for bladder incontinence, dysuria, frequency and hematuria.   Musculoskeletal: Negative for arthralgias, back pain, gait problem and myalgias.  Skin: Negative for itching and rash.  Neurological: Negative for dizziness, extremity weakness, gait problem, headaches, numbness, seizures and  speech difficulty.  Hematological: Does not bruise/bleed easily.  Psychiatric/Behavioral: Negative for depression and sleep disturbance. The patient is not nervous/anxious.     Physical Exam:  weight is 114 lb (51.7 kg). Her oral temperature is 98.6 F (37 C). Her blood pressure is 136/82 and her pulse is 77. Her respiration is 20.   Wt Readings from Last 3 Encounters:  06/12/17 114 lb (51.7 kg)  05/30/17 115 lb 9.6 oz (52.4 kg)  05/16/17 114 lb 3.2 oz (51.8 kg)   Physical Exam  Constitutional: She is oriented to person, place, and time.  HENT:  Head: Normocephalic and atraumatic.  Mouth/Throat: Oropharynx is clear and moist.  Eyes: Pupils are equal, round, and reactive to light. EOM are normal.  Neck: Normal range of motion.  Cardiovascular: Normal rate, regular rhythm and normal heart sounds.  Pulmonary/Chest: Effort normal and breath sounds normal.  Abdominal: Soft. Bowel sounds are normal.  Musculoskeletal: Normal range of motion. She exhibits no edema, tenderness or deformity.  Lymphadenopathy:    She has no cervical adenopathy.  Neurological: She is alert and oriented to person, place, and time.  Skin: Skin is warm and dry. No rash noted. No erythema.  Psychiatric: She has a normal mood and affect. Her behavior is normal. Judgment and thought content normal.  Vitals reviewed.    Lab Results  Component Value Date   WBC 6.8 06/12/2017   HGB 11.7 06/12/2017   HCT 35.0 06/12/2017   MCV 90 06/12/2017   PLT 228 06/12/2017     Chemistry      Component Value Date/Time   NA 138 04/21/2017 1204   NA 137 11/02/2016 1421   K 3.7 04/21/2017 1204   K 3.6 11/02/2016 1421   CL 105 04/21/2017 1204   CL 101 11/02/2016 1421   CO2 25 04/21/2017 1204   CO2 28 11/02/2016 1421   BUN 10 04/21/2017 1204   BUN 11 11/02/2016 1421   CREATININE 0.86 04/21/2017 1204   CREATININE 0.63 11/02/2016 1421      Component Value Date/Time   CALCIUM 9.5 04/21/2017 1204   CALCIUM 9.4  11/02/2016 1421   ALKPHOS 57 11/02/2016 1421   AST 14 11/02/2016 1421   ALT 10 11/02/2016 1421   BILITOT 0.3 11/02/2016 1421         Impression and Plan: Deanna Reynolds is a 67 year old postmenopausal African-American female with a newly diagnosed stage IA infiltrating ductal carcinoma of the right breast.  She has an incredible family history of breast cancer.  I would have to believe that she will have a breast cancer gene.  She does not wish to have the genetic test.  We have her on maintenance Herceptin/Perjeta.  Hopefully, we can stop this after 9 months.  I will give her a dose of Toradol to try to help with the discomfort.  I will call in some Ultram for her.  I do not think she needs any x-rays.  She will continue the Femara.  We will plan  to get her back in another 3 weeks.  I spent about 30 minutes with her today.  Over half the time was spent face-to-face with her trying to figure out why she was having all this breast pain.  Volanda Napoleon, MD 11/5/20184:32 PM

## 2017-12-06 ENCOUNTER — Other Ambulatory Visit: Payer: Self-pay | Admitting: *Deleted

## 2017-12-06 MED ORDER — TEMAZEPAM 15 MG PO CAPS: 15.0000 mg | ORAL_CAPSULE | Freq: Every evening | ORAL | 2 refills | Status: DC | PRN

## 2017-12-13 ENCOUNTER — Inpatient Hospital Stay: Payer: 59 | Attending: Hematology & Oncology | Admitting: Hematology & Oncology

## 2017-12-13 ENCOUNTER — Inpatient Hospital Stay: Payer: 59

## 2017-12-13 ENCOUNTER — Other Ambulatory Visit: Payer: Self-pay

## 2017-12-13 ENCOUNTER — Encounter: Payer: Self-pay | Admitting: Hematology & Oncology

## 2017-12-13 VITALS — BP 150/86 | HR 65 | Temp 98.2°F | Resp 17 | Wt 115.0 lb

## 2017-12-13 DIAGNOSIS — Z5112 Encounter for antineoplastic immunotherapy: Secondary | ICD-10-CM | POA: Diagnosis present

## 2017-12-13 DIAGNOSIS — C50511 Malignant neoplasm of lower-outer quadrant of right female breast: Secondary | ICD-10-CM

## 2017-12-13 DIAGNOSIS — Z170 Estrogen receptor positive status [ER+]: Secondary | ICD-10-CM

## 2017-12-13 DIAGNOSIS — C50911 Malignant neoplasm of unspecified site of right female breast: Secondary | ICD-10-CM | POA: Diagnosis present

## 2017-12-13 LAB — CMP (CANCER CENTER ONLY)
ALT: 27 U/L (ref 10–47)
ANION GAP: 3 — AB (ref 5–15)
AST: 25 U/L (ref 11–38)
Albumin: 3.4 g/dL — ABNORMAL LOW (ref 3.5–5.0)
Alkaline Phosphatase: 46 U/L (ref 26–84)
BILIRUBIN TOTAL: 0.4 mg/dL (ref 0.2–1.6)
BUN: 11 mg/dL (ref 7–22)
CALCIUM: 9.7 mg/dL (ref 8.0–10.3)
CO2: 31 mmol/L (ref 18–33)
Chloride: 108 mmol/L (ref 98–108)
Creatinine: 0.9 mg/dL (ref 0.60–1.20)
GLUCOSE: 103 mg/dL (ref 73–118)
Potassium: 3.4 mmol/L (ref 3.3–4.7)
Sodium: 142 mmol/L (ref 128–145)
TOTAL PROTEIN: 6.9 g/dL (ref 6.4–8.1)

## 2017-12-13 LAB — CBC WITH DIFFERENTIAL (CANCER CENTER ONLY)
BASOS ABS: 0 10*3/uL (ref 0.0–0.1)
Basophils Relative: 0 %
EOS PCT: 2 %
Eosinophils Absolute: 0.1 10*3/uL (ref 0.0–0.5)
HEMATOCRIT: 35.1 % (ref 34.8–46.6)
Hemoglobin: 11.5 g/dL — ABNORMAL LOW (ref 11.6–15.9)
Lymphocytes Relative: 43 %
Lymphs Abs: 1.7 10*3/uL (ref 0.9–3.3)
MCH: 29 pg (ref 26.0–34.0)
MCHC: 32.8 g/dL (ref 32.0–36.0)
MCV: 88.4 fL (ref 81.0–101.0)
Monocytes Absolute: 0.3 10*3/uL (ref 0.1–0.9)
Monocytes Relative: 8 %
NEUTROS PCT: 47 %
Neutro Abs: 1.8 10*3/uL (ref 1.5–6.5)
PLATELETS: 182 10*3/uL (ref 145–400)
RBC: 3.97 MIL/uL (ref 3.70–5.32)
RDW: 12.9 % (ref 11.1–15.7)
WBC: 3.9 10*3/uL (ref 3.9–10.0)

## 2017-12-13 MED ORDER — ACETAMINOPHEN 325 MG PO TABS
650.0000 mg | ORAL_TABLET | Freq: Once | ORAL | Status: AC
  Administered 2017-12-13: 650 mg via ORAL

## 2017-12-13 MED ORDER — SODIUM CHLORIDE 0.9 % IV SOLN
Freq: Once | INTRAVENOUS | Status: AC
Start: 2017-12-13 — End: 2017-12-13
  Administered 2017-12-13: 09:00:00 via INTRAVENOUS

## 2017-12-13 MED ORDER — TRASTUZUMAB CHEMO 150 MG IV SOLR
300.0000 mg | Freq: Once | INTRAVENOUS | Status: AC
  Administered 2017-12-13: 300 mg via INTRAVENOUS
  Filled 2017-12-13: qty 14.29

## 2017-12-13 MED ORDER — SODIUM CHLORIDE 0.9 % IV SOLN
420.0000 mg | Freq: Once | INTRAVENOUS | Status: AC
  Administered 2017-12-13: 420 mg via INTRAVENOUS
  Filled 2017-12-13: qty 14

## 2017-12-13 MED ORDER — HEPARIN SOD (PORK) LOCK FLUSH 100 UNIT/ML IV SOLN
500.0000 [IU] | Freq: Once | INTRAVENOUS | Status: AC | PRN
  Administered 2017-12-13: 500 [IU]
  Filled 2017-12-13: qty 5

## 2017-12-13 MED ORDER — SODIUM CHLORIDE 0.9% FLUSH
10.0000 mL | INTRAVENOUS | Status: DC | PRN
  Administered 2017-12-13: 10 mL
  Filled 2017-12-13: qty 10

## 2017-12-13 MED ORDER — ACETAMINOPHEN 325 MG PO TABS
ORAL_TABLET | ORAL | Status: AC
  Filled 2017-12-13: qty 2

## 2017-12-13 MED ORDER — DIPHENHYDRAMINE HCL 25 MG PO CAPS
50.0000 mg | ORAL_CAPSULE | Freq: Once | ORAL | Status: AC
  Administered 2017-12-13: 50 mg via ORAL

## 2017-12-13 MED ORDER — DIPHENHYDRAMINE HCL 25 MG PO CAPS
ORAL_CAPSULE | ORAL | Status: AC
  Filled 2017-12-13: qty 2

## 2017-12-13 NOTE — Patient Instructions (Signed)
Implanted Port Home Guide An implanted port is a type of central line that is placed under the skin. Central lines are used to provide IV access when treatment or nutrition needs to be given through a person's veins. Implanted ports are used for long-term IV access. An implanted port may be placed because:  You need IV medicine that would be irritating to the small veins in your hands or arms.  You need long-term IV medicines, such as antibiotics.  You need IV nutrition for a long period.  You need frequent blood draws for lab tests.  You need dialysis.  Implanted ports are usually placed in the chest area, but they can also be placed in the upper arm, the abdomen, or the leg. An implanted port has two main parts:  Reservoir. The reservoir is round and will appear as a small, raised area under your skin. The reservoir is the part where a needle is inserted to give medicines or draw blood.  Catheter. The catheter is a thin, flexible tube that extends from the reservoir. The catheter is placed into a large vein. Medicine that is inserted into the reservoir goes into the catheter and then into the vein.  How will I care for my incision site? Do not get the incision site wet. Bathe or shower as directed by your health care provider. How is my port accessed? Special steps must be taken to access the port:  Before the port is accessed, a numbing cream can be placed on the skin. This helps numb the skin over the port site.  Your health care provider uses a sterile technique to access the port. ? Your health care provider must put on a mask and sterile gloves. ? The skin over your port is cleaned carefully with an antiseptic and allowed to dry. ? The port is gently pinched between sterile gloves, and a needle is inserted into the port.  Only "non-coring" port needles should be used to access the port. Once the port is accessed, a blood return should be checked. This helps ensure that the port  is in the vein and is not clogged.  If your port needs to remain accessed for a constant infusion, a clear (transparent) bandage will be placed over the needle site. The bandage and needle will need to be changed every week, or as directed by your health care provider.  Keep the bandage covering the needle clean and dry. Do not get it wet. Follow your health care provider's instructions on how to take a shower or bath while the port is accessed.  If your port does not need to stay accessed, no bandage is needed over the port.  What is flushing? Flushing helps keep the port from getting clogged. Follow your health care provider's instructions on how and when to flush the port. Ports are usually flushed with saline solution or a medicine called heparin. The need for flushing will depend on how the port is used.  If the port is used for intermittent medicines or blood draws, the port will need to be flushed: ? After medicines have been given. ? After blood has been drawn. ? As part of routine maintenance.  If a constant infusion is running, the port may not need to be flushed.  How long will my port stay implanted? The port can stay in for as long as your health care provider thinks it is needed. When it is time for the port to come out, surgery will be   done to remove it. The procedure is similar to the one performed when the port was put in. When should I seek immediate medical care? When you have an implanted port, you should seek immediate medical care if:  You notice a bad smell coming from the incision site.  You have swelling, redness, or drainage at the incision site.  You have more swelling or pain at the port site or the surrounding area.  You have a fever that is not controlled with medicine.  This information is not intended to replace advice given to you by your health care provider. Make sure you discuss any questions you have with your health care provider. Document  Released: 07/25/2005 Document Revised: 12/31/2015 Document Reviewed: 04/01/2013 Elsevier Interactive Patient Education  2017 [employer]  

## 2017-12-13 NOTE — Patient Instructions (Signed)
Pertuzumab injection What is this medicine? PERTUZUMAB (per TOOZ ue mab) is a monoclonal antibody. It is used to treat breast cancer. This medicine may be used for other purposes; ask your health care provider or pharmacist if you have questions. COMMON BRAND NAME(S): PERJETA What should I tell my health care provider before I take this medicine? They need to know if you have any of these conditions: -heart disease -heart failure -high blood pressure -history of irregular heart beat -recent or ongoing radiation therapy -an unusual or allergic reaction to pertuzumab, other medicines, foods, dyes, or preservatives -pregnant or trying to get pregnant -breast-feeding How should I use this medicine? This medicine is for infusion into a vein. It is given by a health care professional in a hospital or clinic setting. Talk to your pediatrician regarding the use of this medicine in children. Special care may be needed. Overdosage: If you think you have taken too much of this medicine contact a poison control center or emergency room at once. NOTE: This medicine is only for you. Do not share this medicine with others. What if I miss a dose? It is important not to miss your dose. Call your doctor or health care professional if you are unable to keep an appointment. What may interact with this medicine? Interactions are not expected. Give your health care provider a list of all the medicines, herbs, non-prescription drugs, or dietary supplements you use. Also tell them if you smoke, drink alcohol, or use illegal drugs. Some items may interact with your medicine. This list may not describe all possible interactions. Give your health care provider a list of all the medicines, herbs, non-prescription drugs, or dietary supplements you use. Also tell them if you smoke, drink alcohol, or use illegal drugs. Some items may interact with your medicine. What should I watch for while using this medicine? Your  condition will be monitored carefully while you are receiving this medicine. Report any side effects. Continue your course of treatment even though you feel ill unless your doctor tells you to stop. Do not become pregnant while taking this medicine or for 7 months after stopping it. Women should inform their doctor if they wish to become pregnant or think they might be pregnant. Women of child-bearing potential will need to have a negative pregnancy test before starting this medicine. There is a potential for serious side effects to an unborn child. Talk to your health care professional or pharmacist for more information. Do not breast-feed an infant while taking this medicine or for 7 months after stopping it. Women must use effective birth control with this medicine. Call your doctor or health care professional for advice if you get a fever, chills or sore throat, or other symptoms of a cold or flu. Do not treat yourself. Try to avoid being around people who are sick. You may experience fever, chills, and headache during the infusion. Report any side effects during the infusion to your health care professional. What side effects may I notice from receiving this medicine? Side effects that you should report to your doctor or health care professional as soon as possible: -breathing problems -chest pain or palpitations -dizziness -feeling faint or lightheaded -fever or chills -skin rash, itching or hives -sore throat -swelling of the face, lips, or tongue -swelling of the legs or ankles -unusually weak or tired Side effects that usually do not require medical attention (report to your doctor or health care professional if they continue or are bothersome): -diarrhea -hair   loss -nausea, vomiting -tiredness This list may not describe all possible side effects. Call your doctor for medical advice about side effects. You may report side effects to FDA at 1-800-FDA-1088. Where should I keep my  medicine? This drug is given in a hospital or clinic and will not be stored at home. NOTE: This sheet is a summary. It may not cover all possible information. If you have questions about this medicine, talk to your doctor, pharmacist, or health care provider.  2018 Elsevier/Gold Standard (2015-08-27 12:08:50) Trastuzumab injection for infusion What is this medicine? TRASTUZUMAB (tras TOO zoo mab) is a monoclonal antibody. It is used to treat breast cancer and stomach cancer. This medicine may be used for other purposes; ask your health care provider or pharmacist if you have questions. COMMON BRAND NAME(S): Herceptin What should I tell my health care provider before I take this medicine? They need to know if you have any of these conditions: -heart disease -heart failure -lung or breathing disease, like asthma -an unusual or allergic reaction to trastuzumab, benzyl alcohol, or other medications, foods, dyes, or preservatives -pregnant or trying to get pregnant -breast-feeding How should I use this medicine? This drug is given as an infusion into a vein. It is administered in a hospital or clinic by a specially trained health care professional. Talk to your pediatrician regarding the use of this medicine in children. This medicine is not approved for use in children. Overdosage: If you think you have taken too much of this medicine contact a poison control center or emergency room at once. NOTE: This medicine is only for you. Do not share this medicine with others. What if I miss a dose? It is important not to miss a dose. Call your doctor or health care professional if you are unable to keep an appointment. What may interact with this medicine? This medicine may interact with the following medications: -certain types of chemotherapy, such as daunorubicin, doxorubicin, epirubicin, and idarubicin This list may not describe all possible interactions. Give your health care provider a list of  all the medicines, herbs, non-prescription drugs, or dietary supplements you use. Also tell them if you smoke, drink alcohol, or use illegal drugs. Some items may interact with your medicine. What should I watch for while using this medicine? Visit your doctor for checks on your progress. Report any side effects. Continue your course of treatment even though you feel ill unless your doctor tells you to stop. Call your doctor or health care professional for advice if you get a fever, chills or sore throat, or other symptoms of a cold or flu. Do not treat yourself. Try to avoid being around people who are sick. You may experience fever, chills and shaking during your first infusion. These effects are usually mild and can be treated with other medicines. Report any side effects during the infusion to your health care professional. Fever and chills usually do not happen with later infusions. Do not become pregnant while taking this medicine or for 7 months after stopping it. Women should inform their doctor if they wish to become pregnant or think they might be pregnant. Women of child-bearing potential will need to have a negative pregnancy test before starting this medicine. There is a potential for serious side effects to an unborn child. Talk to your health care professional or pharmacist for more information. Do not breast-feed an infant while taking this medicine or for 7 months after stopping it. Women must use effective birth control   with this medicine. What side effects may I notice from receiving this medicine? Side effects that you should report to your doctor or health care professional as soon as possible: -allergic reactions like skin rash, itching or hives, swelling of the face, lips, or tongue -chest pain or palpitations -cough -dizziness -feeling faint or lightheaded, falls -fever -general ill feeling or flu-like symptoms -signs of worsening heart failure like breathing problems;  swelling in your legs and feet -unusually weak or tired Side effects that usually do not require medical attention (report to your doctor or health care professional if they continue or are bothersome): -bone pain -changes in taste -diarrhea -joint pain -nausea/vomiting -weight loss This list may not describe all possible side effects. Call your doctor for medical advice about side effects. You may report side effects to FDA at 1-800-FDA-1088. Where should I keep my medicine? This drug is given in a hospital or clinic and will not be stored at home. NOTE: This sheet is a summary. It may not cover all possible information. If you have questions about this medicine, talk to your doctor, pharmacist, or health care provider.  2018 Elsevier/Gold Standard (2016-07-19 14:37:52)  

## 2017-12-13 NOTE — Progress Notes (Signed)
Hematology and Oncology Follow Up Visit  Kimmerly Lora [id number] [birth year] 67 y.o. 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 BMandellis back for follow-up.  She is actually doing much better.  Last time that we saw her, she really had problems with nausea and vomiting and pain.  All this has resolved.  [place] is going up to [place] to [place] mother for Mother's Day weekend.  Her mother is 880years old.  She apparently is having health issues.  She has had no problems with the Femara.  There is no arthralgias or myalgias.  She is had no fever.  She is had no change in bowel or bladder habits.  There is been no cough.  She has had no leg swelling.  She is had no rashes.  Overall, her performance status is ECOG 1.   Medications:  Current Outpatient Medications:  .  Ascorbic Acid (VITAMIN C) 1000 MG tablet, Take 1,000 mg by mouth daily., Disp: , Rfl:  .  Cholecalciferol (VITAMIN D-3) 5000 units TABS, Take 1 tablet by mouth daily., Disp: , Rfl:  .  Cyanocobalamin (VITAMIN B-12 PO), Take 1 tablet by mouth daily., Disp: , Rfl:  .  Flaxseed, Linseed, (FLAX SEED OIL PO), Take by mouth. One teaspoon daily, Disp: , Rfl:  .  hydrochlorothiazide (HYDRODIURIL) 25 MG tablet, 25 mg., Disp: , Rfl:  .  lidocaine-prilocaine (EMLA) cream, Apply 1 application topically as needed., Disp: 30 g, Rfl: 0 .  losartan (COZAAR) 100 MG tablet, , Disp: , Rfl:  .  MAGNESIUM PO, Take 1 tablet by mouth daily., Disp: , Rfl:  .  VITAMIN E PO, Take 1 tablet by mouth daily., Disp: , Rfl:  .  HYDROcodone-acetaminophen (NORCO/VICODIN) 5-325 MG tablet, Take 1-2 tablets by mouth every 6 (six) hours as needed  for moderate pain. (Patient not taking: Reported on 05/30/2017), Disp: 30 tablet, Rfl: 0 .  HYDROcodone-acetaminophen (NORCO/VICODIN) 5-325 MG tablet, Take 1 tablet by mouth every 4 (four) hours as needed for moderate pain. (Patient not taking: Reported on 06/12/2017), Disp: 30 tablet, Rfl: 0 .  zolpidem (AMBIEN CR) 6.25 MG CR tablet, Take 1 tablet (6.25 mg total) by mouth at bedtime as needed for sleep. (Patient not taking: Reported on 05/30/2017), Disp: 20 tablet, Rfl: 0  Allergies:  Allergies  Allergen Reactions  . Motrin [Ibuprofen] Nausea And Vomiting    Past Medical History, Surgical history, Social history, and Family History were reviewed and updated.  Review of Systems: Review of Systems  Constitutional: Negative for appetite change, fatigue, fever and unexpected weight change.  HENT:   Negative for lump/mass, mouth sores, sore throat and trouble swallowing.   Respiratory: Negative for cough, hemoptysis and shortness of breath.   Cardiovascular: Negative for leg swelling and palpitations.  Gastrointestinal: Negative for abdominal distention, abdominal pain, blood in stool, constipation, diarrhea, nausea and vomiting.  Genitourinary: Negative for bladder incontinence, dysuria, frequency and hematuria.   Musculoskeletal: Negative for arthralgias, back pain, gait problem and myalgias.  Skin: Negative for itching and rash.  Neurological: Negative for dizziness, extremity weakness, gait problem, headaches, numbness, seizures and speech difficulty.  Hematological: Does not bruise/bleed easily.  Psychiatric/Behavioral:  Negative for depression and sleep disturbance. The patient is not nervous/anxious.     Physical Exam:  weight is 114 lb (51.7 kg). Her oral temperature is 98.6 F (37 C). Her blood pressure is 136/82 and her pulse is 77. Her respiration is 20.   Wt Readings from Last 3 Encounters:  06/12/17 114 lb (51.7 kg)  05/30/17 115 lb 9.6 oz (52.4 kg)  05/16/17 114 lb 3.2 oz  (51.8 kg)   Physical Exam  Constitutional: She is oriented to person, place, and time.  HENT:  Head: Normocephalic and atraumatic.  Mouth/Throat: Oropharynx is clear and moist.  Eyes: Pupils are equal, round, and reactive to light. EOM are normal.  Neck: Normal range of motion.  Cardiovascular: Normal rate, regular rhythm and normal heart sounds.  Pulmonary/Chest: Effort normal and breath sounds normal.  Abdominal: Soft. Bowel sounds are normal.  Musculoskeletal: Normal range of motion. She exhibits no edema, tenderness or deformity.  Lymphadenopathy:    She has no cervical adenopathy.  Neurological: She is alert and oriented to person, place, and time.  Skin: Skin is warm and dry. No rash noted. No erythema.  Psychiatric: She has a normal mood and affect. Her behavior is normal. Judgment and thought content normal.  Vitals reviewed.    Lab Results  Component Value Date   WBC 6.8 06/12/2017   HGB 11.7 06/12/2017   HCT 35.0 06/12/2017   MCV 90 06/12/2017   PLT 228 06/12/2017     Chemistry      Component Value Date/Time   NA 138 04/21/2017 1204   NA 137 11/02/2016 1421   K 3.7 04/21/2017 1204   K 3.6 11/02/2016 1421   CL 105 04/21/2017 1204   CL 101 11/02/2016 1421   CO2 25 04/21/2017 1204   CO2 28 11/02/2016 1421   BUN 10 04/21/2017 1204   BUN 11 11/02/2016 1421   CREATININE 0.86 04/21/2017 1204   CREATININE 0.63 11/02/2016 1421      Component Value Date/Time   CALCIUM 9.5 04/21/2017 1204   CALCIUM 9.4 11/02/2016 1421   ALKPHOS 57 11/02/2016 1421   AST 14 11/02/2016 1421   ALT 10 11/02/2016 1421   BILITOT 0.3 11/02/2016 1421         Impression and Plan: Ms. Rowe is a 67 year old postmenopausal African-American female with a newly diagnosed stage IA infiltrating ductal carcinoma of the right breast.  She has an incredible family history of breast cancer.  I would have to believe that she will have a breast cancer gene.  She does not wish to have the genetic  test.  We have her on maintenance Herceptin/Perjeta.  Hopefully, we can stop this after 9 months.  Thankfully, she is doing so well right now.  We will go ahead with her Herceptin/Perjeta.  Her last echocardiogram was back in March.  We probably will get one set up for July.  We will go ahead and plan to get her back in another 3 weeks.   Volanda Napoleon, MD 11/5/20184:32 PM

## 2017-12-15 ENCOUNTER — Other Ambulatory Visit: Payer: Self-pay | Admitting: Hematology & Oncology

## 2017-12-15 MED ORDER — SUVOREXANT 20 MG PO TABS: 20.0000 mg | ORAL_TABLET | Freq: Every evening | ORAL | 3 refills | Status: DC | PRN

## 2018-01-03 ENCOUNTER — Inpatient Hospital Stay: Payer: 59

## 2018-01-03 ENCOUNTER — Other Ambulatory Visit: Payer: Self-pay

## 2018-01-03 ENCOUNTER — Inpatient Hospital Stay (HOSPITAL_BASED_OUTPATIENT_CLINIC_OR_DEPARTMENT_OTHER): Payer: 59 | Admitting: Hematology & Oncology

## 2018-01-03 VITALS — BP 170/97 | HR 80 | Temp 98.1°F | Resp 20 | Wt 116.0 lb

## 2018-01-03 DIAGNOSIS — K2100 Gastro-esophageal reflux disease with esophagitis, without bleeding: Secondary | ICD-10-CM

## 2018-01-03 DIAGNOSIS — Z170 Estrogen receptor positive status [ER+]: Secondary | ICD-10-CM

## 2018-01-03 DIAGNOSIS — C50511 Malignant neoplasm of lower-outer quadrant of right female breast: Secondary | ICD-10-CM

## 2018-01-03 DIAGNOSIS — C50911 Malignant neoplasm of unspecified site of right female breast: Secondary | ICD-10-CM | POA: Diagnosis not present

## 2018-01-03 DIAGNOSIS — Z5112 Encounter for antineoplastic immunotherapy: Secondary | ICD-10-CM | POA: Diagnosis not present

## 2018-01-03 LAB — CBC WITH DIFFERENTIAL (CANCER CENTER ONLY)
BASOS PCT: 0 %
Basophils Absolute: 0 10*3/uL (ref 0.0–0.1)
EOS PCT: 2 %
Eosinophils Absolute: 0.2 10*3/uL (ref 0.0–0.5)
HCT: 36.5 % (ref 34.8–46.6)
Hemoglobin: 12.1 g/dL (ref 11.6–15.9)
Lymphocytes Relative: 31 %
Lymphs Abs: 1.9 10*3/uL (ref 0.9–3.3)
MCH: 29.2 pg (ref 26.0–34.0)
MCHC: 33.2 g/dL (ref 32.0–36.0)
MCV: 88.2 fL (ref 81.0–101.0)
Monocytes Absolute: 0.4 10*3/uL (ref 0.1–0.9)
Monocytes Relative: 7 %
Neutro Abs: 3.7 10*3/uL (ref 1.5–6.5)
Neutrophils Relative %: 60 %
PLATELETS: 204 10*3/uL (ref 145–400)
RBC: 4.14 MIL/uL (ref 3.70–5.32)
RDW: 13.4 % (ref 11.1–15.7)
WBC: 6.2 10*3/uL (ref 3.9–10.0)

## 2018-01-03 LAB — CMP (CANCER CENTER ONLY)
ALBUMIN: 3.5 g/dL (ref 3.5–5.0)
ALK PHOS: 45 U/L (ref 26–84)
ALT: 28 U/L (ref 10–47)
AST: 27 U/L (ref 11–38)
Anion gap: 5 (ref 5–15)
BILIRUBIN TOTAL: 0.6 mg/dL (ref 0.2–1.6)
BUN: 13 mg/dL (ref 7–22)
CO2: 28 mmol/L (ref 18–33)
Calcium: 9.3 mg/dL (ref 8.0–10.3)
Chloride: 109 mmol/L — ABNORMAL HIGH (ref 98–108)
Creatinine: 0.8 mg/dL (ref 0.60–1.20)
Glucose, Bld: 101 mg/dL (ref 73–118)
Potassium: 3.4 mmol/L (ref 3.3–4.7)
SODIUM: 142 mmol/L (ref 128–145)
TOTAL PROTEIN: 7.1 g/dL (ref 6.4–8.1)

## 2018-01-03 MED ORDER — ACETAMINOPHEN 325 MG PO TABS
650.0000 mg | ORAL_TABLET | Freq: Once | ORAL | Status: AC
  Administered 2018-01-03: 650 mg via ORAL

## 2018-01-03 MED ORDER — SODIUM CHLORIDE 0.9 % IV SOLN
300.0000 mg | Freq: Once | INTRAVENOUS | Status: AC
  Administered 2018-01-03: 300 mg via INTRAVENOUS
  Filled 2018-01-03: qty 14.29

## 2018-01-03 MED ORDER — HEPARIN SOD (PORK) LOCK FLUSH 100 UNIT/ML IV SOLN
500.0000 [IU] | Freq: Once | INTRAVENOUS | Status: AC | PRN
  Administered 2018-01-03: 500 [IU]
  Filled 2018-01-03: qty 5

## 2018-01-03 MED ORDER — SODIUM CHLORIDE 0.9% FLUSH
10.0000 mL | INTRAVENOUS | Status: DC | PRN
  Administered 2018-01-03: 10 mL
  Filled 2018-01-03: qty 10

## 2018-01-03 MED ORDER — SODIUM CHLORIDE 0.9 % IV SOLN
Freq: Once | INTRAVENOUS | Status: AC
  Administered 2018-01-03: 09:00:00 via INTRAVENOUS

## 2018-01-03 MED ORDER — DIPHENHYDRAMINE HCL 25 MG PO CAPS
50.0000 mg | ORAL_CAPSULE | Freq: Once | ORAL | Status: AC
  Administered 2018-01-03: 50 mg via ORAL

## 2018-01-03 MED ORDER — DIPHENHYDRAMINE HCL 25 MG PO CAPS
ORAL_CAPSULE | ORAL | Status: AC
  Filled 2018-01-03: qty 2

## 2018-01-03 MED ORDER — ACETAMINOPHEN 325 MG PO TABS
ORAL_TABLET | ORAL | Status: AC
  Filled 2018-01-03: qty 2

## 2018-01-03 MED ORDER — SODIUM CHLORIDE 0.9 % IV SOLN
420.0000 mg | Freq: Once | INTRAVENOUS | Status: AC
  Administered 2018-01-03: 420 mg via INTRAVENOUS
  Filled 2018-01-03: qty 14

## 2018-01-03 NOTE — Patient Instructions (Signed)
Pertuzumab injection What is this medicine? PERTUZUMAB (per TOOZ ue mab) is a monoclonal antibody. It is used to treat breast cancer. This medicine may be used for other purposes; ask your health care provider or pharmacist if you have questions. COMMON BRAND NAME(S): PERJETA What should I tell my health care provider before I take this medicine? They need to know if you have any of these conditions: -heart disease -heart failure -high blood pressure -history of irregular heart beat -recent or ongoing radiation therapy -an unusual or allergic reaction to pertuzumab, other medicines, foods, dyes, or preservatives -pregnant or trying to get pregnant -breast-feeding How should I use this medicine? This medicine is for infusion into a vein. It is given by a health care professional in a hospital or clinic setting. Talk to your pediatrician regarding the use of this medicine in children. Special care may be needed. Overdosage: If you think you have taken too much of this medicine contact a poison control center or emergency room at once. NOTE: This medicine is only for you. Do not share this medicine with others. What if I miss a dose? It is important not to miss your dose. Call your doctor or health care professional if you are unable to keep an appointment. What may interact with this medicine? Interactions are not expected. Give your health care provider a list of all the medicines, herbs, non-prescription drugs, or dietary supplements you use. Also tell them if you smoke, drink alcohol, or use illegal drugs. Some items may interact with your medicine. This list may not describe all possible interactions. Give your health care provider a list of all the medicines, herbs, non-prescription drugs, or dietary supplements you use. Also tell them if you smoke, drink alcohol, or use illegal drugs. Some items may interact with your medicine. What should I watch for while using this medicine? Your  condition will be monitored carefully while you are receiving this medicine. Report any side effects. Continue your course of treatment even though you feel ill unless your doctor tells you to stop. Do not become pregnant while taking this medicine or for 7 months after stopping it. Women should inform their doctor if they wish to become pregnant or think they might be pregnant. Women of child-bearing potential will need to have a negative pregnancy test before starting this medicine. There is a potential for serious side effects to an unborn child. Talk to your health care professional or pharmacist for more information. Do not breast-feed an infant while taking this medicine or for 7 months after stopping it. Women must use effective birth control with this medicine. Call your doctor or health care professional for advice if you get a fever, chills or sore throat, or other symptoms of a cold or flu. Do not treat yourself. Try to avoid being around people who are sick. You may experience fever, chills, and headache during the infusion. Report any side effects during the infusion to your health care professional. What side effects may I notice from receiving this medicine? Side effects that you should report to your doctor or health care professional as soon as possible: -breathing problems -chest pain or palpitations -dizziness -feeling faint or lightheaded -fever or chills -skin rash, itching or hives -sore throat -swelling of the face, lips, or tongue -swelling of the legs or ankles -unusually weak or tired Side effects that usually do not require medical attention (report to your doctor or health care professional if they continue or are bothersome): -diarrhea -hair   loss -nausea, vomiting -tiredness This list may not describe all possible side effects. Call your doctor for medical advice about side effects. You may report side effects to FDA at 1-800-FDA-1088. Where should I keep my  medicine? This drug is given in a hospital or clinic and will not be stored at home. NOTE: This sheet is a summary. It may not cover all possible information. If you have questions about this medicine, talk to your doctor, pharmacist, or health care provider.  2018 Elsevier/Gold Standard (2015-08-27 12:08:50) Trastuzumab injection for infusion What is this medicine? TRASTUZUMAB (tras TOO zoo mab) is a monoclonal antibody. It is used to treat breast cancer and stomach cancer. This medicine may be used for other purposes; ask your health care provider or pharmacist if you have questions. COMMON BRAND NAME(S): Herceptin What should I tell my health care provider before I take this medicine? They need to know if you have any of these conditions: -heart disease -heart failure -lung or breathing disease, like asthma -an unusual or allergic reaction to trastuzumab, benzyl alcohol, or other medications, foods, dyes, or preservatives -pregnant or trying to get pregnant -breast-feeding How should I use this medicine? This drug is given as an infusion into a vein. It is administered in a hospital or clinic by a specially trained health care professional. Talk to your pediatrician regarding the use of this medicine in children. This medicine is not approved for use in children. Overdosage: If you think you have taken too much of this medicine contact a poison control center or emergency room at once. NOTE: This medicine is only for you. Do not share this medicine with others. What if I miss a dose? It is important not to miss a dose. Call your doctor or health care professional if you are unable to keep an appointment. What may interact with this medicine? This medicine may interact with the following medications: -certain types of chemotherapy, such as daunorubicin, doxorubicin, epirubicin, and idarubicin This list may not describe all possible interactions. Give your health care provider a list of  all the medicines, herbs, non-prescription drugs, or dietary supplements you use. Also tell them if you smoke, drink alcohol, or use illegal drugs. Some items may interact with your medicine. What should I watch for while using this medicine? Visit your doctor for checks on your progress. Report any side effects. Continue your course of treatment even though you feel ill unless your doctor tells you to stop. Call your doctor or health care professional for advice if you get a fever, chills or sore throat, or other symptoms of a cold or flu. Do not treat yourself. Try to avoid being around people who are sick. You may experience fever, chills and shaking during your first infusion. These effects are usually mild and can be treated with other medicines. Report any side effects during the infusion to your health care professional. Fever and chills usually do not happen with later infusions. Do not become pregnant while taking this medicine or for 7 months after stopping it. Women should inform their doctor if they wish to become pregnant or think they might be pregnant. Women of child-bearing potential will need to have a negative pregnancy test before starting this medicine. There is a potential for serious side effects to an unborn child. Talk to your health care professional or pharmacist for more information. Do not breast-feed an infant while taking this medicine or for 7 months after stopping it. Women must use effective birth control   with this medicine. What side effects may I notice from receiving this medicine? Side effects that you should report to your doctor or health care professional as soon as possible: -allergic reactions like skin rash, itching or hives, swelling of the face, lips, or tongue -chest pain or palpitations -cough -dizziness -feeling faint or lightheaded, falls -fever -general ill feeling or flu-like symptoms -signs of worsening heart failure like breathing problems;  swelling in your legs and feet -unusually weak or tired Side effects that usually do not require medical attention (report to your doctor or health care professional if they continue or are bothersome): -bone pain -changes in taste -diarrhea -joint pain -nausea/vomiting -weight loss This list may not describe all possible side effects. Call your doctor for medical advice about side effects. You may report side effects to FDA at 1-800-FDA-1088. Where should I keep my medicine? This drug is given in a hospital or clinic and will not be stored at home. NOTE: This sheet is a summary. It may not cover all possible information. If you have questions about this medicine, talk to your doctor, pharmacist, or health care provider.  2018 Elsevier/Gold Standard (2016-07-19 14:37:52)  

## 2018-01-03 NOTE — Progress Notes (Signed)
Hematology and Oncology Follow Up Visit  Deanna Reynolds [id number] August 21, 1950 67 y.o. 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  She had a good Mother's Day weekend.  [place] in [place]  She has to go up for the summertime now.  Her mom is not doing all that well.  As such, this will be her last dose of Herceptin/Perjeta.  She is doing well with the right breast.  Her breast pain is improving.  She saw the radiation oncologist last week.  She was quite pleased with how Deanna Reynolds was healing up.  She is had no problems with nausea or vomiting.  Her hair is coming back roll nicely.  She is happy about this.  She has had no change in bowel or bladder habits.  She has had no leg swelling.  There is been no rashes.  She has had no fever.  Overall, her performance status is ECOG 1.   Medications:  Current Outpatient Medications:  .  Ascorbic Acid (VITAMIN C) 1000 MG tablet, Take 1,000 mg by mouth daily., Disp: , Rfl:  .  Cholecalciferol (VITAMIN D-3) 5000 units TABS, Take 1 tablet by mouth daily., Disp: , Rfl:  .  Cyanocobalamin (VITAMIN B-12 PO), Take 1 tablet by mouth daily., Disp: , Rfl:  .  Flaxseed, Linseed, (FLAX SEED OIL PO), Take by mouth. One teaspoon daily, Disp: , Rfl:  .  hydrochlorothiazide (HYDRODIURIL) 25 MG tablet, 25 mg., Disp: , Rfl:  .  lidocaine-prilocaine (EMLA) cream, Apply 1 application topically as needed., Disp: 30 g, Rfl: 0 .  losartan (COZAAR) 100 MG tablet, , Disp: , Rfl:  .  MAGNESIUM PO, Take 1 tablet by mouth daily., Disp: , Rfl:  .  VITAMIN E PO, Take 1 tablet by mouth daily., Disp: , Rfl:  .   HYDROcodone-acetaminophen (NORCO/VICODIN) 5-325 MG tablet, Take 1-2 tablets by mouth every 6 (six) hours as needed for moderate pain. (Patient not taking: Reported on 05/30/2017), Disp: 30 tablet, Rfl: 0 .  HYDROcodone-acetaminophen (NORCO/VICODIN) 5-325 MG tablet, Take 1 tablet by mouth every 4 (four) hours as needed for moderate pain. (Patient not taking: Reported on 06/12/2017), Disp: 30 tablet, Rfl: 0 .  zolpidem (AMBIEN CR) 6.25 MG CR tablet, Take 1 tablet (6.25 mg total) by mouth at bedtime as needed for sleep. (Patient not taking: Reported on 05/30/2017), Disp: 20 tablet, Rfl: 0  Allergies:  Allergies  Allergen Reactions  . Motrin [Ibuprofen] Nausea And Vomiting    Past Medical History, Surgical history, Social history, and Family History were reviewed and updated.  Review of Systems: Review of Systems  Constitutional: Negative for appetite change, fatigue, fever and unexpected weight change.  HENT:   Negative for lump/mass, mouth sores, sore throat and trouble swallowing.   Respiratory: Negative for cough, hemoptysis and shortness of breath.   Cardiovascular: Negative for leg swelling and palpitations.  Gastrointestinal: Negative for abdominal distention, abdominal pain, blood in stool, constipation, diarrhea, nausea and vomiting.  Genitourinary: Negative for bladder incontinence, dysuria, frequency and hematuria.   Musculoskeletal: Negative for arthralgias, back pain, gait problem and myalgias.  Skin: Negative for itching and rash.  Neurological:  Negative for dizziness, extremity weakness, gait problem, headaches, numbness, seizures and speech difficulty.  Hematological: Does not bruise/bleed easily.  Psychiatric/Behavioral: Negative for depression and sleep disturbance. The patient is not nervous/anxious.     Physical Exam:  weight is 114 lb (51.7 kg). Her oral temperature is 98.6 F (37 C). Her blood pressure is 136/82 and her pulse is 77. Her respiration is 20.   Wt Readings  from Last 3 Encounters:  06/12/17 114 lb (51.7 kg)  05/30/17 115 lb 9.6 oz (52.4 kg)  05/16/17 114 lb 3.2 oz (51.8 kg)   Physical Exam  Constitutional: She is oriented to person, place, and time.  HENT:  Head: Normocephalic and atraumatic.  Mouth/Throat: Oropharynx is clear and moist.  Eyes: Pupils are equal, round, and reactive to light. EOM are normal.  Neck: Normal range of motion.  Cardiovascular: Normal rate, regular rhythm and normal heart sounds.  Pulmonary/Chest: Effort normal and breath sounds normal.  Abdominal: Soft. Bowel sounds are normal.  Musculoskeletal: Normal range of motion. She exhibits no edema, tenderness or deformity.  Lymphadenopathy:    She has no cervical adenopathy.  Neurological: She is alert and oriented to person, place, and time.  Skin: Skin is warm and dry. No rash noted. No erythema.  Psychiatric: She has a normal mood and affect. Her behavior is normal. Judgment and thought content normal.  Vitals reviewed.    Lab Results  Component Value Date   WBC 6.8 06/12/2017   HGB 11.7 06/12/2017   HCT 35.0 06/12/2017   MCV 90 06/12/2017   PLT 228 06/12/2017     Chemistry      Component Value Date/Time   NA 138 04/21/2017 1204   NA 137 11/02/2016 1421   K 3.7 04/21/2017 1204   K 3.6 11/02/2016 1421   CL 105 04/21/2017 1204   CL 101 11/02/2016 1421   CO2 25 04/21/2017 1204   CO2 28 11/02/2016 1421   BUN 10 04/21/2017 1204   BUN 11 11/02/2016 1421   CREATININE 0.86 04/21/2017 1204   CREATININE 0.63 11/02/2016 1421      Component Value Date/Time   CALCIUM 9.5 04/21/2017 1204   CALCIUM 9.4 11/02/2016 1421   ALKPHOS 57 11/02/2016 1421   AST 14 11/02/2016 1421   ALT 10 11/02/2016 1421   BILITOT 0.3 11/02/2016 1421         Impression and Plan: Deanna Reynolds is a 67 year old postmenopausal African-American female with a newly diagnosed stage IA infiltrating ductal carcinoma of the right breast.  She has an incredible family history of breast  cancer.  I would have to believe that she will have a breast cancer gene.  She does not wish to have the genetic test.  Again, this will be her last Herceptin/Perjeta.  She will be leaving next week for [place].  She will be back around Labor Day.  We will just plan to see her back when she returns.  I will see about doing an echocardiogram after we see her back.  I do not see any issues with respect to any type of heart issues.  She is excited about going up to [place].  She wants to try to do what she can to help her mom.   Volanda Napoleon, MD 11/5/20184:32 PM

## 2018-01-12 ENCOUNTER — Other Ambulatory Visit: Payer: Self-pay | Admitting: *Deleted

## 2018-01-12 MED ORDER — TRAMADOL HCL 50 MG PO TABS: 50.0000 mg | ORAL_TABLET | Freq: Four times a day (QID) | ORAL | 0 refills | Status: AC | PRN

## 2018-01-24 ENCOUNTER — Ambulatory Visit: Payer: 59

## 2018-01-24 ENCOUNTER — Ambulatory Visit: Payer: 59 | Admitting: Hematology & Oncology

## 2018-01-24 ENCOUNTER — Other Ambulatory Visit: Payer: 59

## 2018-02-14 ENCOUNTER — Ambulatory Visit: Payer: 59

## 2018-02-14 ENCOUNTER — Other Ambulatory Visit: Payer: 59

## 2018-02-14 ENCOUNTER — Ambulatory Visit: Payer: 59 | Admitting: Hematology & Oncology

## 2018-02-15 ENCOUNTER — Other Ambulatory Visit: Payer: Self-pay | Admitting: *Deleted

## 2018-02-15 MED ORDER — SUVOREXANT 20 MG PO TABS: 20.0000 mg | ORAL_TABLET | Freq: Every evening | ORAL | 3 refills | Status: AC | PRN

## 2018-02-20 ENCOUNTER — Telehealth: Payer: Self-pay | Admitting: Hematology & Oncology

## 2018-02-20 NOTE — Telephone Encounter (Signed)
Faxed medical records to: Attn: [facility] [address], [place] [address] P: [phone] F: [place] SCANNED

## 2018-04-18 ENCOUNTER — Other Ambulatory Visit: Payer: 59

## 2018-04-18 ENCOUNTER — Ambulatory Visit: Payer: 59 | Admitting: Hematology & Oncology

## 2018-04-23 ENCOUNTER — Telehealth: Payer: Self-pay | Admitting: Hematology & Oncology

## 2018-04-23 NOTE — Telephone Encounter (Signed)
Faxed medical records to [facility], Release [phone]

## 2019-01-25 IMAGING — CT CT L SPINE W/O CM
3 series · 14 of 33 positions shown, 17 images · non-contrast
Comparison: 02/22/2017

CLINICAL DATA: Back pain that awoke the patient at 4 a.m., with the
down the legs.

EXAM:
CT LUMBAR SPINE WITHOUT CONTRAST
TECHNIQUE: Multidetector CT imaging of the lumbar spine was performed without
intravenous contrast administration. Multiplanar CT image
reconstructions were also generated.

[Series 4: l spine soft · axial · 0.27mm/px · z∈[-233,-75]mm · 6 of 103 slices shown, 8 images]
[im 16/103  soft-tissue]
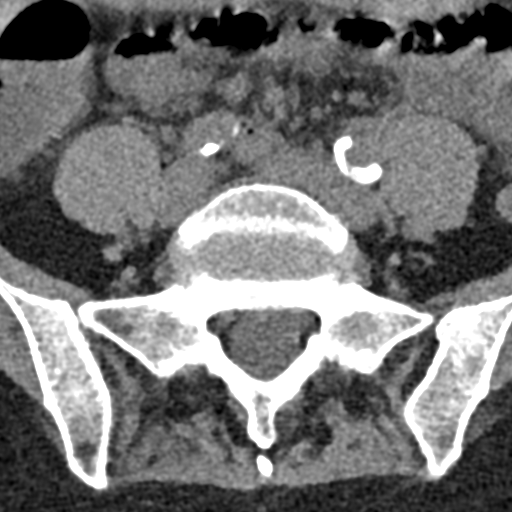
[im 16/103  bone]
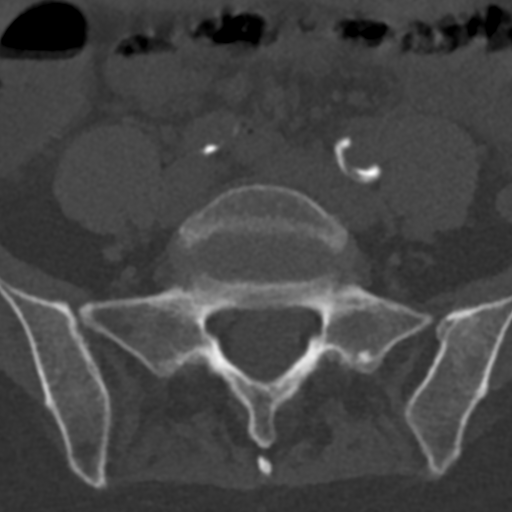
[im 32/103  bone]
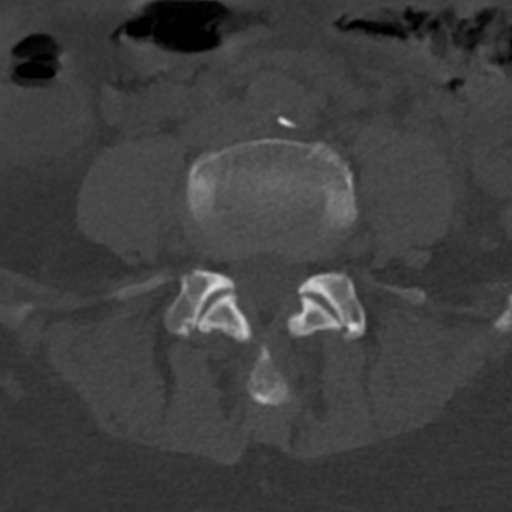
[im 48/103  bone]
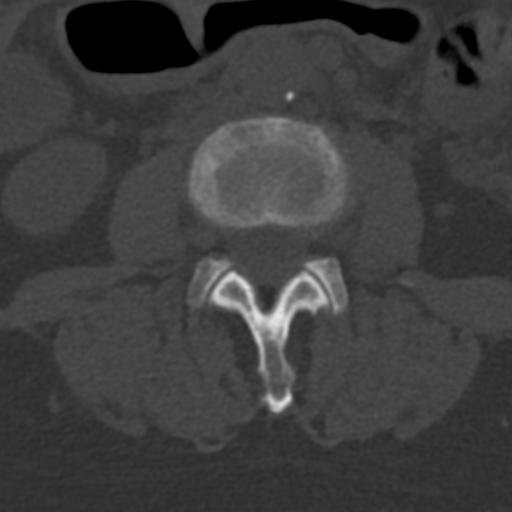
[im 63/103  bone]
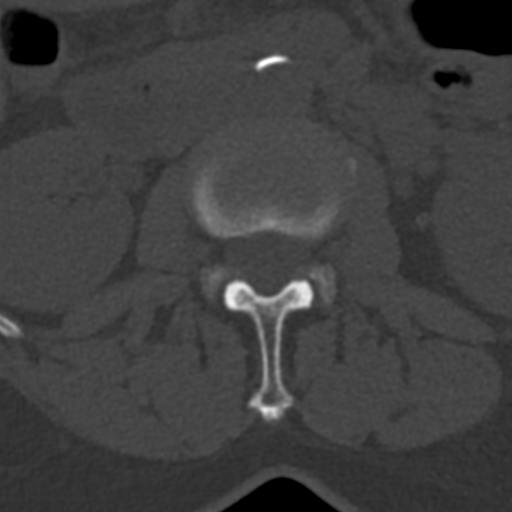
[im 79/103  soft-tissue]
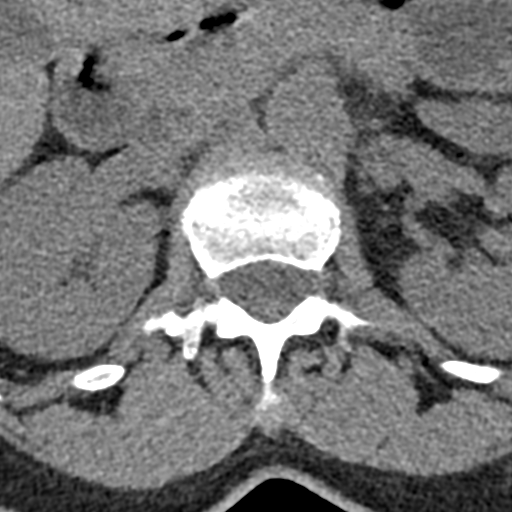
[im 79/103  bone]
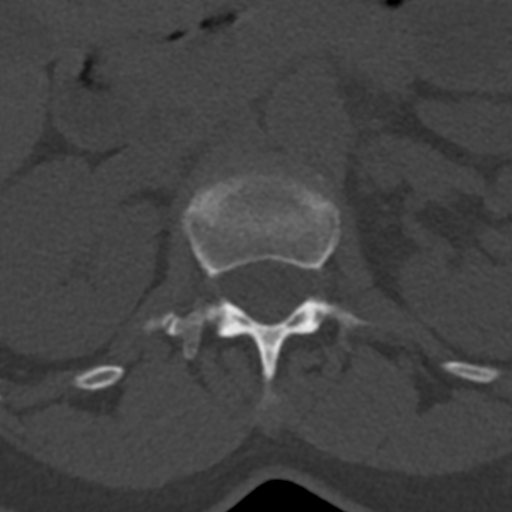
[im 95/103  bone]
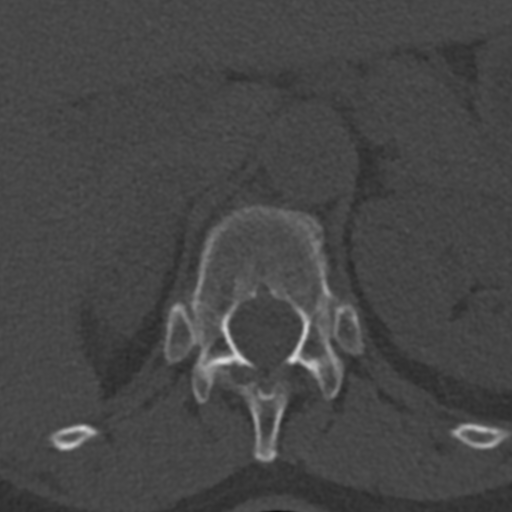

[Series 7: sagittal bone · sagittal · 0.32mm/px · 5 of 81 slices shown, 6 images]
[im 27/81  bone]
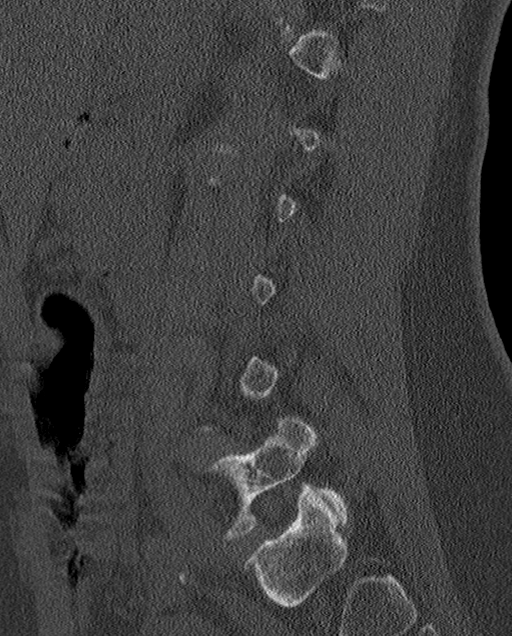
[im 34/81  bone]
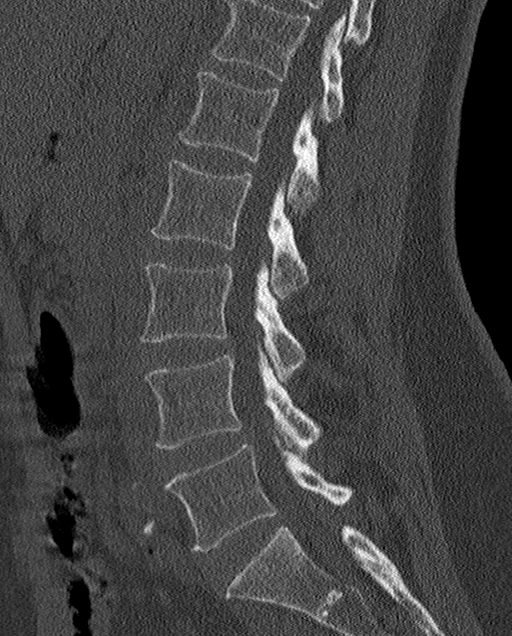
[im 41/81  soft-tissue]
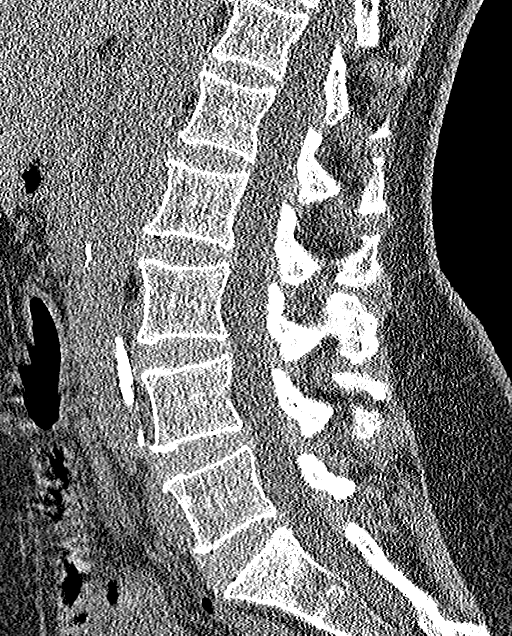
[im 41/81  bone]
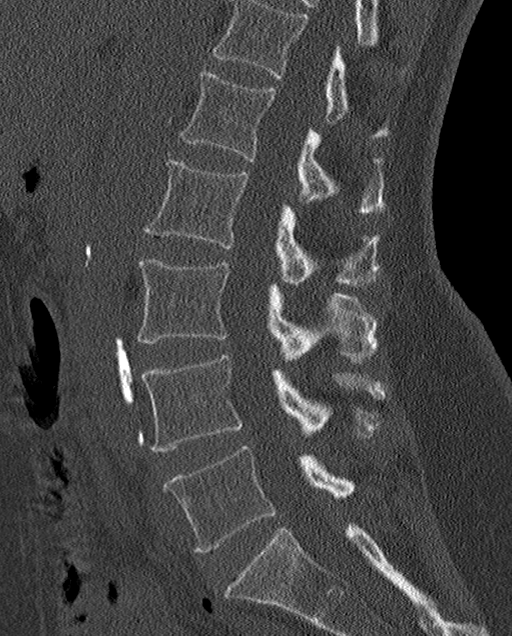
[im 47/81  bone]
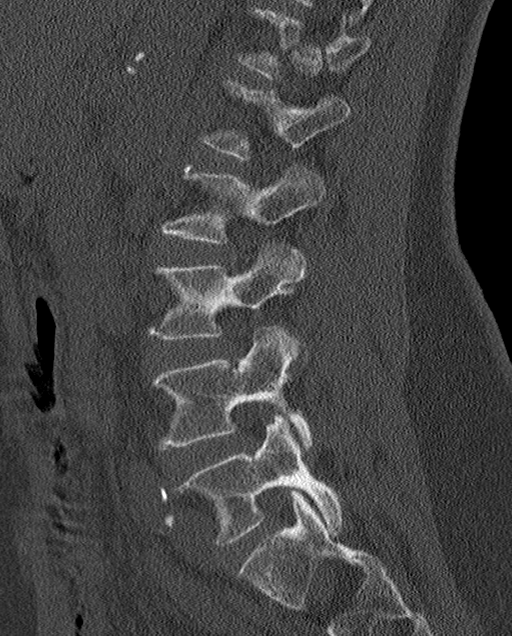
[im 54/81  bone]
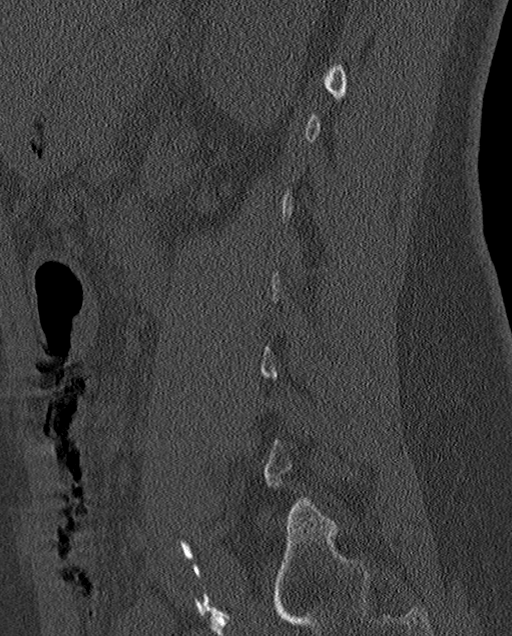

[Series 8: coronal bone · coronal · 0.30mm/px · 3 of 71 slices shown]
[im 15/71  bone]
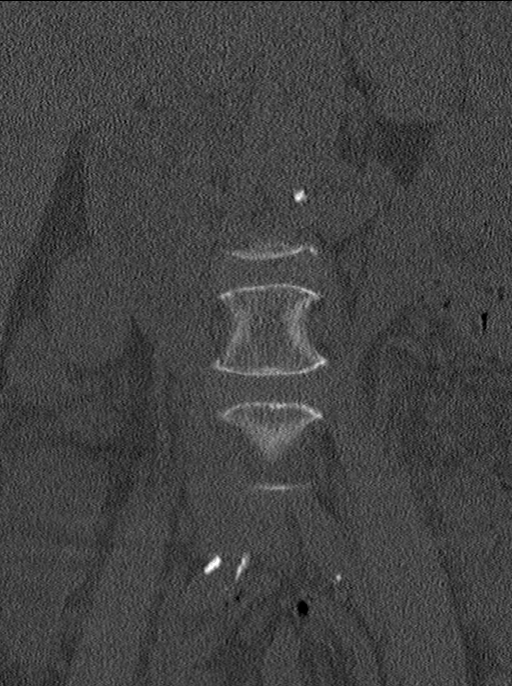
[im 29/71  bone]
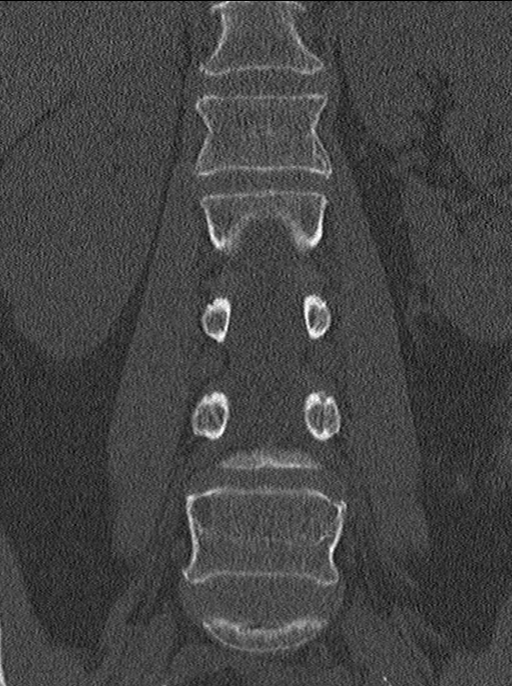
[im 43/71  bone]
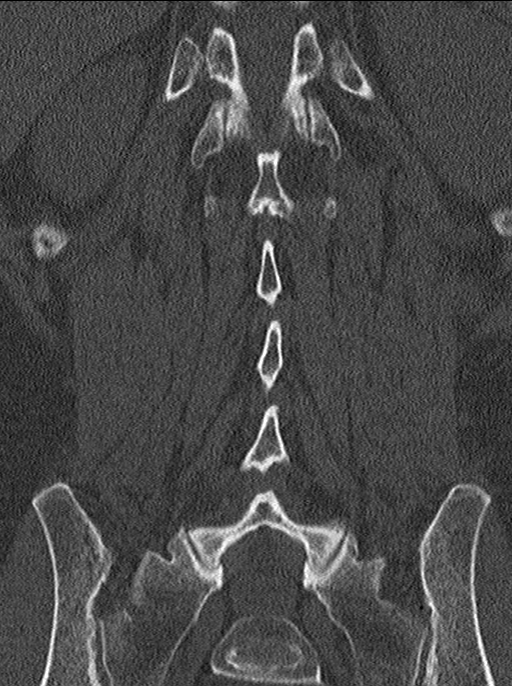

[14 of 33 positions shown; findings below may reference images not displayed]

FINDINGS: Segmentation: 5 lumbar type vertebrae.

Alignment: Normal.

Vertebrae: No acute fracture or focal pathologic process.

Paraspinal and other soft tissues: No explanation for symptoms.
Aortic and iliac atherosclerosis. No hydronephrosis or visible
urolithiasis.

Disc levels: The disc height diffusely. Minor endplate and lower
lumbar facet spurs. No evidence of impingement.
IMPRESSION: No acute finding. No notable degenerative changes or evidence of
impingement.

## 2019-03-06 ENCOUNTER — Other Ambulatory Visit: Payer: Self-pay | Admitting: Nurse Practitioner

## 2020-03-09 ENCOUNTER — Encounter: Payer: Self-pay | Admitting: Genetic Counselor

## 2020-03-09 NOTE — Progress Notes (Addendum)
UPDATE: PMS2 c.[id number] (p.[id number]) was reclassified as a result of re-review of the evidence in light of new variant interpretation guidelines and/or new information.  The updated report date is March 06, 2020.   UPDATE: MEN1 c.839T>C (p.[id number])  VUS was reclassified to Likely Benign.  The amended report date is November 28, 2022.

## 2022-12-01 ENCOUNTER — Encounter: Payer: Self-pay | Admitting: Genetic Counselor
